# Patient Record
Sex: Male | Born: 1945 | Race: Black or African American | Hispanic: No | Marital: Single | Smoking: Former smoker
Health system: Southern US, Community
[De-identification: ages and names within clinical notes are randomized; demographics above are authoritative.]

## PROBLEM LIST (undated history)

## (undated) DIAGNOSIS — E119 Type 2 diabetes mellitus without complications: Secondary | ICD-10-CM

## (undated) DIAGNOSIS — Z86010 Personal history of colonic polyps: Secondary | ICD-10-CM

## (undated) HISTORY — PX: BACK SURGERY: SHX140

## (undated) HISTORY — PX: NECK SURGERY: SHX720

## (undated) HISTORY — DX: Personal history of colonic polyps: Z86.010

---

## 2000-07-17 ENCOUNTER — Encounter: Payer: Self-pay | Admitting: Family Medicine

## 2000-07-17 ENCOUNTER — Ambulatory Visit (HOSPITAL_COMMUNITY): Admission: RE | Admit: 2000-07-17 | Discharge: 2000-07-17 | Payer: Self-pay | Admitting: Family Medicine

## 2009-06-04 ENCOUNTER — Emergency Department (HOSPITAL_COMMUNITY): Admission: EM | Admit: 2009-06-04 | Discharge: 2009-06-04 | Payer: Self-pay | Admitting: Emergency Medicine

## 2009-06-11 ENCOUNTER — Ambulatory Visit: Payer: Self-pay | Admitting: Family Medicine

## 2009-07-11 ENCOUNTER — Ambulatory Visit: Payer: Self-pay | Admitting: Family Medicine

## 2009-07-25 ENCOUNTER — Encounter (INDEPENDENT_AMBULATORY_CARE_PROVIDER_SITE_OTHER): Payer: Self-pay | Admitting: *Deleted

## 2009-07-26 ENCOUNTER — Ambulatory Visit: Payer: Self-pay | Admitting: Internal Medicine

## 2009-08-09 ENCOUNTER — Ambulatory Visit: Payer: Self-pay | Admitting: Internal Medicine

## 2009-08-09 DIAGNOSIS — Z860100 Personal history of colon polyps, unspecified: Secondary | ICD-10-CM

## 2009-08-09 HISTORY — DX: Personal history of colonic polyps: Z86.010

## 2009-08-09 HISTORY — DX: Personal history of colon polyps, unspecified: Z86.0100

## 2009-08-13 ENCOUNTER — Encounter: Payer: Self-pay | Admitting: Internal Medicine

## 2009-08-27 ENCOUNTER — Encounter: Admission: RE | Admit: 2009-08-27 | Discharge: 2009-08-27 | Payer: Self-pay | Admitting: Orthopedic Surgery

## 2010-06-20 NOTE — Letter (Signed)
Summary: Patient Notice- Polyp Results  [facility]  [address] [place], [place] [address]   Phone: [phone]  Fax: [phone]        August 13, 2009 MRN: [id number]    George Collins [address] [place], [place]  [address]    Dear [name],  The polyp removed from your colon was adenomatous. This means that it was pre-cancerous or that  it had the potential to change into cancer over time.   I recommend that you have a repeat colonoscopy in 3 years to determine if you have developed any new polyps over time. If you develop any new rectal bleeding, abdominal pain or significant bowel habit changes, please contact us before then.  In addition to repeating colonoscopy, changing health habits may reduce your risk of having more colon polyps and possibly, colon cancer. You may lower your risk of future polyps and colon cancer by adopting healthy habits such as not smoking or using tobacco (if you do), being physically active, losing weight (if overweight), and eating a diet which includes fruits and vegetables and limits red meat.  Please call us if you are having persistent problems or have questions about your condition that have not been fully answered at this time.  Sincerely,  Iva Boop MD, [facility]  This letter has been electronically signed by your physician.  Appended Document: Patient Notice- Polyp Results letter mailed 3.28.11

## 2010-06-20 NOTE — Miscellaneous (Signed)
Summary: DIR COL SCR-AGE...EM  Clinical Lists Changes  Medications: Added new medication of MOVIPREP 100 GM  SOLR (PEG-KCL-NACL-NASULF-NA ASC-C) As directed - Signed Rx of MOVIPREP 100 GM  SOLR (PEG-KCL-NACL-NASULF-NA ASC-C) As directed;  #1 x 0;  Signed;  Entered by: Clide Cliff RN;  Authorized by: Iva Boop MD, FACG;  Method used: Electronically to [id number] [address] [place], [place]  [address], Ph: [phone] or [phone], Fax: [phone] Observations: Added new observation of NKA: T (07/26/2009 13:11)    Prescriptions: MOVIPREP 100 GM  SOLR (PEG-KCL-NACL-NASULF-NA ASC-C) As directed  #1 x 0   Entered by:   Clide Cliff RN   Authorized by:   Iva Boop MD, [facility]   Signed by:   Clide Cliff RN on 07/26/2009   Method used:   Electronically to        [id number] (retail)       [address]       [place], [place]  [address]       Ph: [phone] or [phone]       Fax: [phone]   RxID:   [id number]

## 2010-06-20 NOTE — Letter (Signed)
Summary: [facility] The Instructions  [facility]  [address] [place], [place] [address]   Phone: [phone]  Fax: [phone]       George Collins    [birth year]    MRN: [id number]        Procedure Day /Date:  Thursday 08/09/2009     Arrival Time: 10:00 am      Procedure Time: 11:00 am     Location of Procedure:                    _ x_  [facility] (4th Floor)   PREPARATION FOR COLONOSCOPY WITH MOVIPREP   Starting 5 days prior to your procedure Saturday 3/19 do not eat nuts, seeds, popcorn, corn, beans, peas,  salads, or any raw vegetables.  Do not take any fiber supplements (e.g. Metamucil, Citrucel, and Benefiber).  THE DAY BEFORE YOUR PROCEDURE         DATE: Wednesday 3/23  1.  Drink clear liquids the entire day-NO SOLID FOOD  2.  Do not drink anything colored red or purple.  Avoid juices with pulp.  No orange juice.  3.  Drink at least 64 oz. (8 glasses) of fluid/clear liquids during the day to prevent dehydration and help the prep work efficiently.  CLEAR LIQUIDS INCLUDE: Water Jello Ice Popsicles Tea (sugar ok, no milk/cream) Powdered fruit flavored drinks Coffee (sugar ok, no milk/cream) Gatorade Juice: apple, white grape, white cranberry  Lemonade Clear bullion, consomm, broth Carbonated beverages (any kind) Strained chicken noodle soup Hard Candy                             4.  In the morning, mix first dose of MoviPrep solution:    Empty 1 Pouch A and 1 Pouch B into the disposable container    Add lukewarm drinking water to the top line of the container. Mix to dissolve    Refrigerate (mixed solution should be used within 24 hrs)  5.  Begin drinking the prep at 5:00 p.m. The MoviPrep container is divided by 4 marks.   Every 15 minutes drink the solution down to the next mark (approximately 8 oz) until the full liter is complete.   6.  Follow completed prep with 16 oz of clear liquid of your choice (Nothing red or purple).   Continue to drink clear liquids until bedtime.  7.  Before going to bed, mix second dose of MoviPrep solution:    Empty 1 Pouch A and 1 Pouch B into the disposable container    Add lukewarm drinking water to the top line of the container. Mix to dissolve    Refrigerate  THE DAY OF YOUR PROCEDURE      DATE: Thursday 3/24  Beginning at 6:00 am (5 hours before procedure):         1. Every 15 minutes, drink the solution down to the next mark (approx 8 oz) until the full liter is complete.  2. Follow completed prep with 16 oz. of clear liquid of your choice.    3. You may drink clear liquids until 9:00 am (2 HOURS BEFORE PROCEDURE).   MEDICATION INSTRUCTIONS  Unless otherwise instructed, you should take regular prescription medications with a small sip of water   as early as possible the morning of your procedure.           OTHER INSTRUCTIONS  You will need a responsible adult  at least 63 years of age to accompany you and drive you home.   This person must remain in the waiting room during your procedure.  Wear loose fitting clothing that is easily removed.  Leave jewelry and other valuables at home.  However, you may wish to bring a book to read or  an iPod/MP3 player to listen to music as you wait for your procedure to start.  Remove all body piercing jewelry and leave at home.  Total time from sign-in until discharge is approximately 2-3 hours.  You should go home directly after your procedure and rest.  You can resume normal activities the  day after your procedure.  The day of your procedure you should not:   Drive   Make legal decisions   Operate machinery   Drink alcohol   Return to work  You will receive specific instructions about eating, activities and medications before you leave.    The above instructions have been reviewed and explained to me by   Clide Cliff, RN_______________________    I fully understand and can verbalize these  instructions _____________________________ Date _________

## 2010-06-20 NOTE — Procedures (Signed)
Summary: Colonoscopy  Patient: Ercell Razon Note: All result statuses are Final unless otherwise noted.  Tests: (1) Colonoscopy (COL)   COL Colonoscopy           DONE     [facility]     [address]     [place], [place]  [address]           COLONOSCOPY PROCEDURE REPORT           PATIENT:  Woodson, Macha  MR#:  [id number]     BIRTHDATE:  10-Jul-1945, 63 yrs. old  GENDER:  male     ENDOSCOPIST:  Iva Boop, MD, [facility]     REF. BY:  Sharlot Gowda, M.D.     PROCEDURE DATE:  08/09/2009     PROCEDURE:  Colonoscopy with snare polypectomy     ASA CLASS:  Class I     INDICATIONS:  Routine Risk Screening     MEDICATIONS:   Fentanyl 75 mcg IV, Versed 7 mg IV           DESCRIPTION OF PROCEDURE:   After the risks benefits and     alternatives of the procedure were thoroughly explained, informed     consent was obtained.  Digital rectal exam was performed and     revealed no abnormalities and normal prostate.   The LB CF-H180AL     [id number] endoscope was introduced through the anus and advanced to     the cecum, which was identified by both the appendix and ileocecal     valve, without limitations.  The quality of the prep was adequate,     using Nulytley.  The instrument was then slowly withdrawn as the     colon was fully examined.     Insertion: 3:59 minutes withdrawal: 14:15 minutes     <<PROCEDUREIMAGES>>           FINDINGS:  A pedunculated polyp was found in the rectum. It was 12     mm in size. Polyp was snared, then cauterized with monopolar     cautery. Retrieval was successful.  This was otherwise a normal     examination of the colon.   Retroflexed views in the rectum     revealed internal hemorrhoids.    The scope was then withdrawn     from the patient and the procedure completed.           COMPLICATIONS:  None     ENDOSCOPIC IMPRESSION:     1) 12 mm sessile polyp in the proximal rectum - removed           2) Internal hemorrhoids           3) Otherwise  normal examination, adequate prep           RECOMMENDATIONS:     1) No aspirin or NSAID's for 1 week.           REPEAT EXAM:  In for Colonoscopy, pending biopsy results.           Iva Boop, MD, [facility]           CC:  Sharlot Gowda, MD     The Patient           n.     eSIGNED:   Iva Boop at 08/09/2009 11:26 AM           Artis Delay, [id number]  Note: An exclamation mark (!) indicates a result that  was not dispersed into the flowsheet. Document Creation Date: 08/09/2009 11:27 AM _______________________________________________________________________  (1) Order result status: Final Collection or observation date-time: 08/09/2009 11:17 Requested date-time:  Receipt date-time:  Reported date-time:  Referring Physician:   Ordering Physician: Stan Head [id number]) Specimen Source:  Source: Launa Grill Order Number: [id number] Lab site:   Appended Document: Colonoscopy     Procedures Next Due Date:    Colonoscopy: 08/2012

## 2010-08-05 LAB — DIFFERENTIAL
Basophils Absolute: 0 10*3/uL (ref 0.0–0.1)
Basophils Relative: 0 % (ref 0–1)
Eosinophils Absolute: 0.2 10*3/uL (ref 0.0–0.7)
Eosinophils Relative: 1 % (ref 0–5)
Lymphocytes Relative: 14 % (ref 12–46)
Lymphs Abs: 1.8 10*3/uL (ref 0.7–4.0)
Monocytes Absolute: 0.9 10*3/uL (ref 0.1–1.0)
Monocytes Relative: 7 % (ref 3–12)
Neutro Abs: 9.9 10*3/uL — ABNORMAL HIGH (ref 1.7–7.7)
Neutrophils Relative %: 78 % — ABNORMAL HIGH (ref 43–77)

## 2010-08-05 LAB — CBC
HCT: 47.9 % (ref 39.0–52.0)
Hemoglobin: 16.2 g/dL (ref 13.0–17.0)
MCHC: 33.8 g/dL (ref 30.0–36.0)
MCV: 96.4 fL (ref 78.0–100.0)
Platelets: 251 10*3/uL (ref 150–400)
RBC: 4.97 MIL/uL (ref 4.22–5.81)
RDW: 13.2 % (ref 11.5–15.5)
WBC: 12.8 10*3/uL — ABNORMAL HIGH (ref 4.0–10.5)

## 2012-07-23 ENCOUNTER — Encounter: Payer: Self-pay | Admitting: Internal Medicine

## 2012-07-30 ENCOUNTER — Encounter: Payer: Self-pay | Admitting: Internal Medicine

## 2013-03-17 ENCOUNTER — Encounter: Payer: Self-pay | Admitting: Internal Medicine

## 2014-09-11 ENCOUNTER — Encounter: Payer: Self-pay | Admitting: Internal Medicine

## 2016-07-03 ENCOUNTER — Emergency Department (HOSPITAL_BASED_OUTPATIENT_CLINIC_OR_DEPARTMENT_OTHER)
Admission: EM | Admit: 2016-07-03 | Discharge: 2016-07-03 | Disposition: A | Discharge status: Home / Self Care | Payer: No Typology Code available for payment source | Attending: Emergency Medicine | Admitting: Emergency Medicine

## 2016-07-03 ENCOUNTER — Encounter (HOSPITAL_BASED_OUTPATIENT_CLINIC_OR_DEPARTMENT_OTHER): Payer: Self-pay

## 2016-07-03 ENCOUNTER — Emergency Department (HOSPITAL_BASED_OUTPATIENT_CLINIC_OR_DEPARTMENT_OTHER): Payer: No Typology Code available for payment source

## 2016-07-03 DIAGNOSIS — M545 Low back pain: Secondary | ICD-10-CM | POA: Insufficient documentation

## 2016-07-03 DIAGNOSIS — Z7984 Long term (current) use of oral hypoglycemic drugs: Secondary | ICD-10-CM | POA: Diagnosis not present

## 2016-07-03 DIAGNOSIS — Y9389 Activity, other specified: Secondary | ICD-10-CM | POA: Insufficient documentation

## 2016-07-03 DIAGNOSIS — E119 Type 2 diabetes mellitus without complications: Secondary | ICD-10-CM | POA: Insufficient documentation

## 2016-07-03 DIAGNOSIS — Y999 Unspecified external cause status: Secondary | ICD-10-CM | POA: Diagnosis not present

## 2016-07-03 DIAGNOSIS — Y92410 Unspecified street and highway as the place of occurrence of the external cause: Secondary | ICD-10-CM | POA: Diagnosis not present

## 2016-07-03 DIAGNOSIS — S199XXA Unspecified injury of neck, initial encounter: Secondary | ICD-10-CM | POA: Diagnosis present

## 2016-07-03 DIAGNOSIS — F17200 Nicotine dependence, unspecified, uncomplicated: Secondary | ICD-10-CM | POA: Insufficient documentation

## 2016-07-03 DIAGNOSIS — M542 Cervicalgia: Secondary | ICD-10-CM | POA: Diagnosis not present

## 2016-07-03 HISTORY — DX: Type 2 diabetes mellitus without complications: E11.9

## 2016-07-03 MED ORDER — ACETAMINOPHEN 325 MG PO TABS
650.0000 mg | ORAL_TABLET | Freq: Once | ORAL | Status: AC
  Administered 2016-07-03: 650 mg via ORAL
  Filled 2016-07-03: qty 2

## 2016-07-03 MED ORDER — CYCLOBENZAPRINE HCL 10 MG PO TABS: 10.0000 mg | ORAL_TABLET | Freq: Two times a day (BID) | ORAL | 0 refills | Status: DC | PRN

## 2016-07-03 MED FILL — CYCLOBENZAPRINE 10 MG TAB: 10 | 10 days supply | Qty: 20 | Fill #0
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
## 2016-07-03 NOTE — ED Notes (Signed)
ED Provider at bedside discussing test results and dispo plan of care. 

## 2016-07-03 NOTE — ED Notes (Signed)
ED Provider at bedside. 
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
## 2016-07-03 NOTE — Discharge Instructions (Signed)
Use Flexeril twice a day as needed for muscle pain and muscle spasm. Use Tylenol every 6 hours at home as needed for pain. Use warm compress to the area, massaging, and stretching. Please see her primary care provider if her symptoms are not improved in 1 week.  Get help right away if: You have: Numbness, tingling, or weakness in your arms or legs. Severe neck pain, especially tenderness in the middle of the back of your neck. Changes in bowel or bladder control. Increasing pain in any area of your body. Shortness of breath or light-headedness. Chest pain. Blood in your urine, stool, or vomit. Severe pain in your abdomen or your back. Severe or worsening headaches. Sudden vision loss or double vision. Your eye suddenly becomes red. Your pupil is an odd shape or size.

## 2019-07-02 ENCOUNTER — Ambulatory Visit: Payer: Medicare Other | Attending: Internal Medicine

## 2019-08-12 ENCOUNTER — Encounter: Payer: Self-pay | Admitting: General Practice

## 2020-05-07 ENCOUNTER — Ambulatory Visit: Payer: Medicare Other | Admitting: Cardiology

## 2020-05-07 ENCOUNTER — Other Ambulatory Visit: Payer: Self-pay

## 2020-05-07 ENCOUNTER — Encounter: Payer: Self-pay | Admitting: Cardiology

## 2020-05-07 VITALS — BP 130/80 | HR 76 | Ht 66.0 in | Wt 159.0 lb

## 2020-05-07 DIAGNOSIS — R0602 Shortness of breath: Secondary | ICD-10-CM | POA: Diagnosis not present

## 2020-05-07 DIAGNOSIS — R072 Precordial pain: Secondary | ICD-10-CM | POA: Diagnosis not present

## 2020-05-07 DIAGNOSIS — Z01812 Encounter for preprocedural laboratory examination: Secondary | ICD-10-CM

## 2020-05-07 MED ORDER — METOPROLOL TARTRATE 100 MG PO TABS: 100.0000 mg | ORAL_TABLET | Freq: Once | ORAL | 0 refills | Status: DC

## 2020-05-07 NOTE — Progress Notes (Signed)
Cardiology Office Note:    Date:  05/07/2020   ID:  George Collins, DOB 1945/12/02, MRN [id number]  PCP:  Glenis Smoker, MD  [facility] Cardiologist:  Candee Furbish, MD  [facility] Electrophysiologist:  None   Referring MD: Glenis Smoker, *     History of Present Illness:    George Collins is a 74 y.o. male here for the evaluation of shortness of breath at the request of Dr. Lindell Noe.  In review of in office note from 05/01/2020 he was feeling shortness of breath at baseline.  Stops if he gets tired.  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  He also gets care at the [facility] hospital working with a lift chair for his stairs.  Has diabetes, ongoing tobacco use half pack per day and is taking statin therapy.  There is been concerned about potential coronary artery disease given his multiple risk factors of age diabetes smoking.  COPD as well could be causing some of the symptoms.  Creatinine 0.85 glucose 142 hemoglobin A1c 6.1 potassium 4.1  Past Medical History:  Diagnosis Date  . Diabetes mellitus without complication ([place])   . 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 BACK SURGERY    . NECK SURGERY      Current Medications: Current Meds  Medication Sig  . atorvastatin (LIPITOR) 10 MG tablet Take 10 mg by mouth daily.  . cyclobenzaprine (FLEXERIL) 10 MG tablet Take 1 tablet (10 mg total) by mouth 2 (two) times daily as needed for muscle spasms.  . melatonin 3 MG TABS tablet Take 3 mg by mouth at bedtime.  Marland Kitchen METFORMIN HCL PO Take by mouth.     Allergies:   Patient has no known allergies.   Social History   Socioeconomic History  . Marital status: Single    Spouse name: Not on file  . Number of children: Not on file  . Years of education: Not on file  . Highest education level: Not on file  Occupational History  . Not on file  Tobacco Use  . Smoking  status: Current Every Day Smoker  . Smokeless tobacco: Never Used  Substance and Sexual Activity  . Alcohol use: Yes    Comment: occ  . Drug use: No  . Sexual activity: Not on file  Other Topics Concern  . Not on file  Social History Narrative  . Not on file   Social Determinants of Health   Financial Resource Strain: Not on file  Food Insecurity: Not on file  Transportation Needs: Not on file  Physical Activity: Not on file  Stress: Not on file  Social Connections: Not on file     Family History: The patient's mother lived into her 40s.  No early family history of CAD  ROS:   Please see the history of present illness.    Shortness of breath, tightness, no fevers chills nausea vomiting all other systems reviewed and are negative.  EKGs/Labs/Other Studies Reviewed:     EKG:  EKG is  ordered today.  The ekg ordered today demonstrates sinus rhythm 76 with no other abnormalities prior EKG from July 29, 2019 showed sinus rhythm 72 with no other significant abnormalities.  Recent Labs: No results found for requested labs within last 8760 hours.  Recent Lipid Panel No results found for: CHOL, TRIG, HDL, CHOLHDL, VLDL, LDLCALC, LDLDIRECT  Risk Assessment/Calculations:       Physical Exam:    VS:  BP 130/80 (BP Location: Left Arm, Patient Position: Sitting, Cuff Size: Normal)   Pulse 76   Ht $R'5\' 6"'Ga$  (1.676 m)   Wt 159 lb (72.1 kg)   SpO2 95%   BMI 25.66 kg/m     Wt Readings from Last 3 Encounters:  05/07/20 159 lb (72.1 kg)  07/03/16 178 lb (80.7 kg)     GEN:  Well nourished, well developed in no acute distress HEENT: Normal NECK: No JVD; No carotid bruits LYMPHATICS: No lymphadenopathy CARDIAC: RRR, no murmurs, rubs, gallops RESPIRATORY:  Clear to auscultation without rales, wheezing or rhonchi  ABDOMEN: Soft, non-tender, non-distended MUSCULOSKELETAL:  No edema; No deformity  SKIN: Warm and dry NEUROLOGIC:  Alert and oriented x 3 PSYCHIATRIC:  Normal  affect   ASSESSMENT:    1. Pre-procedure lab exam   2. Precordial pain   3. Shortness of breath    PLAN:    In order of problems listed above:  Shortness of breath/chest tightness -Multiple risk factors including smoking diabetes age.  -We will go ahead and check an echocardiogram to ensure proper structure and function of his heart -We will also check a coronary CT scan to evaluate for any signs of atherosclerosis that may be significant leading to his symptoms. -I agree with Dr. Lindell Noe that if coronary anatomy and pump function is reassuring, this certainly could be COPD related. PFTs would be warranted.  Tobacco use -Encourage cessation. He used to smoke 1 pack a day now down to half a pack.  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 He stated that every now and then the machines would break down and fumes would consume the area.  Will follow up with results of tests  Medication Adjustments/Labs and Tests Ordered: Current medicines are reviewed at length with the patient today.  Concerns regarding medicines are outlined above.  Orders Placed This Encounter  Procedures  . CT CORONARY MORPH W/CTA COR W/SCORE W/CA W/CM &/OR WO/CM  . CT CORONARY FRACTIONAL FLOW RESERVE DATA PREP  . CT CORONARY FRACTIONAL FLOW RESERVE FLUID ANALYSIS  . Basic metabolic panel  . EKG 12-Lead  . ECHOCARDIOGRAM COMPLETE   Meds ordered this encounter  Medications  . metoprolol tartrate (LOPRESSOR) 100 MG tablet    Sig: Take 1 tablet (100 mg total) by mouth once for 1 dose.    Dispense:  1 tablet    Refill:  0    Patient Instructions  Medication Instructions:  The current medical regimen is effective;  continue present plan and medications.  *If you need a refill on your cardiac medications before your next appointment, please call your pharmacy*  Lab Work: Will need to have blood work before your CT scan (BMP) If you have labs (blood work) drawn  today and your tests are completely normal, you will receive your results only by: Marland Kitchen MyChart Message (if you have MyChart) OR . A paper copy in the mail If you have any lab test that is abnormal or we need to change your treatment, we will call you to review the results.  Testing/Procedures: Your physician has requested that you have an echocardiogram. Echocardiography is a painless test that uses sound waves to create images of your heart. It provides your doctor with information about the size and shape of your heart and how well your heart's chambers and valves are working. This procedure takes  approximately one hour. There are no restrictions for this procedure.  Your cardiac CT will be scheduled at:   [facility] [address] [place], [place] [address] [phone]  Please arrive at the [facility] main entrance of [facility] 30 minutes prior to test start time. Proceed to the [facility] Radiology Department (first floor) to check-in and test prep.  Please follow these instructions carefully (unless otherwise directed):  On the Night Before the Test: . Be sure to Drink plenty of water. . Do not consume any caffeinated/decaffeinated beverages or chocolate 12 hours prior to your test. . Do not take any antihistamines 12 hours prior to your test.  On the Day of the Test: . Drink plenty of water. Do not drink any water within one hour of the test. . Do not eat any food 4 hours prior to the test. . You may take your regular medications prior to the test.  . Take metoprolol (Lopressor) two hours prior to test. . HOLD Furosemide/Hydrochlorothiazide morning of the test.  After the Test: . Drink plenty of water. . After receiving IV contrast, you may experience a mild flushed feeling. This is normal. . On occasion, you may experience a mild rash up to 24 hours after the test. This is not dangerous. If this occurs, you can take Benadryl 25 mg and increase your  fluid intake. . If you experience trouble breathing, this can be serious. If it is severe call 911 IMMEDIATELY. If it is mild, please call our office. . If you take any of these medications: Glipizide/Metformin, Avandament, Glucavance, please do not take 48 hours after completing test unless otherwise instructed.  Once we have confirmed authorization from your insurance company, we will call you to set up a date and time for your test. Based on how quickly your insurance processes prior authorizations requests, please allow up to 4 weeks to be contacted for scheduling your Cardiac CT appointment. Be advised that routine Cardiac CT appointments could be scheduled as many as 8 weeks after your provider has ordered it.  For non-scheduling related questions, please contact the cardiac imaging nurse navigator should you have any questions/concerns: Marchia Bond, Cardiac Imaging Nurse Navigator Burley Saver, Interim Cardiac Imaging Nurse [place] and Vascular Services Direct Office Dial: [phone]   For scheduling needs, including cancellations and rescheduling, please call [place], [phone].  Follow-Up: At [facility], you and your health needs are our priority.  As part of our continuing mission to provide you with exceptional heart care, we have created designated Provider Care Teams.  These Care Teams include your primary Cardiologist (physician) and Advanced Practice Providers (APPs -  Physician Assistants and Nurse Practitioners) who all work together to provide you with the care you need, when you need it.  We recommend signing up for the patient portal called "MyChart".  Sign up information is provided on this After Visit Summary.  MyChart is used to connect with patients for Virtual Visits (Telemedicine).  Patients are able to view lab/test results, encounter notes, upcoming appointments, etc.  Non-urgent messages can be sent to your provider as well.   To learn more  about what you can do with MyChart, go to [email or web address].    Your next appointment:   Follow up as determined after the above testing has been completed.  Thank you for choosing [facility]!!        Signed, Candee Furbish, MD  05/07/2020 5:04 PM  [facility]

## 2020-05-07 NOTE — Patient Instructions (Addendum)
Medication Instructions:  The current medical regimen is effective;  continue present plan and medications.  *If you need a refill on your cardiac medications before your next appointment, please call your pharmacy*  Lab Work: Will need to have blood work before your CT scan (BMP) If you have labs (blood work) drawn today and your tests are completely normal, you will receive your results only by: Marland Kitchen MyChart Message (if you have MyChart) OR . A paper copy in the mail If you have any lab test that is abnormal or we need to change your treatment, we will call you to review the results.  Testing/Procedures: Your physician has requested that you have an echocardiogram. Echocardiography is a painless test that uses sound waves to create images of your heart. It provides your doctor with information about the size and shape of your heart and how well your heart's chambers and valves are working. This procedure takes approximately one hour. There are no restrictions for this procedure.  Your cardiac CT will be scheduled at:   [facility] [address] [place], [place] [address] [phone]  Please arrive at the [facility] main entrance of [facility] 30 minutes prior to test start time. Proceed to the [facility] Radiology Department (first floor) to check-in and test prep.  Please follow these instructions carefully (unless otherwise directed):  On the Night Before the Test: . Be sure to Drink plenty of water. . Do not consume any caffeinated/decaffeinated beverages or chocolate 12 hours prior to your test. . Do not take any antihistamines 12 hours prior to your test.  On the Day of the Test: . Drink plenty of water. Do not drink any water within one hour of the test. . Do not eat any food 4 hours prior to the test. . You may take your regular medications prior to the test.  . Take metoprolol (Lopressor) two hours prior to test. . HOLD  Furosemide/Hydrochlorothiazide morning of the test.  After the Test: . Drink plenty of water. . After receiving IV contrast, you may experience a mild flushed feeling. This is normal. . On occasion, you may experience a mild rash up to 24 hours after the test. This is not dangerous. If this occurs, you can take Benadryl 25 mg and increase your fluid intake. . If you experience trouble breathing, this can be serious. If it is severe call 911 IMMEDIATELY. If it is mild, please call our office. . If you take any of these medications: Glipizide/Metformin, Avandament, Glucavance, please do not take 48 hours after completing test unless otherwise instructed.  Once we have confirmed authorization from your insurance company, we will call you to set up a date and time for your test. Based on how quickly your insurance processes prior authorizations requests, please allow up to 4 weeks to be contacted for scheduling your Cardiac CT appointment. Be advised that routine Cardiac CT appointments could be scheduled as many as 8 weeks after your provider has ordered it.  For non-scheduling related questions, please contact the cardiac imaging nurse navigator should you have any questions/concerns: Marchia Bond, Cardiac Imaging Nurse Navigator Burley Saver, Interim Cardiac Imaging Nurse [place] and Vascular Services Direct Office Dial: [phone]   For scheduling needs, including cancellations and rescheduling, please call [place], [phone].  Follow-Up: At [facility], you and your health needs are our priority.  As part of our continuing mission to provide you with exceptional heart care, we have created  designated Provider Care Teams.  These Care Teams include your primary Cardiologist (physician) and Advanced Practice Providers (APPs -  Physician Assistants and Nurse Practitioners) who all work together to provide you with the care you need, when you need it.  We recommend signing  up for the patient portal called "MyChart".  Sign up information is provided on this After Visit Summary.  MyChart is used to connect with patients for Virtual Visits (Telemedicine).  Patients are able to view lab/test results, encounter notes, upcoming appointments, etc.  Non-urgent messages can be sent to your provider as well.   To learn more about what you can do with MyChart, go to [email or web address].    Your next appointment:   Follow up as determined after the above testing has been completed.  Thank you for choosing [facility]!!

## 2020-05-25 ENCOUNTER — Telehealth: Payer: Self-pay | Admitting: *Deleted

## 2020-05-25 MED ORDER — METOPROLOL TARTRATE 100 MG PO TABS: 100.0000 mg | ORAL_TABLET | Freq: Once | ORAL | 0 refills | Status: DC

## 2020-05-25 NOTE — Telephone Encounter (Signed)
Pt aware to come in for BMP on Thursday 1/13 before his CT scheduled on 1/17.  Also aware to pick up Metoprolol tartrate tablet sent into [facility] [address] to take before CT.

## 2020-05-31 ENCOUNTER — Other Ambulatory Visit: Payer: Medicare Other | Admitting: *Deleted

## 2020-05-31 DIAGNOSIS — R0602 Shortness of breath: Secondary | ICD-10-CM | POA: Diagnosis not present

## 2020-05-31 DIAGNOSIS — Z01812 Encounter for preprocedural laboratory examination: Secondary | ICD-10-CM | POA: Diagnosis not present

## 2020-05-31 DIAGNOSIS — R072 Precordial pain: Secondary | ICD-10-CM

## 2020-06-01 ENCOUNTER — Telehealth (HOSPITAL_COMMUNITY): Payer: Self-pay | Admitting: Emergency Medicine

## 2020-06-01 LAB — BASIC METABOLIC PANEL
BUN/Creatinine Ratio: 15 (ref 10–24)
BUN: 13 mg/dL (ref 8–27)
CO2: 26 mmol/L (ref 20–29)
Calcium: 9.3 mg/dL (ref 8.6–10.2)
Chloride: 98 mmol/L (ref 96–106)
Creatinine, Ser: 0.86 mg/dL (ref 0.76–1.27)
GFR calc Af Amer: 100 mL/min/{1.73_m2} (ref >59)
GFR calc non Af Amer: 87 mL/min/{1.73_m2} (ref >59)
Glucose: 159 mg/dL — ABNORMAL HIGH (ref 65–99)
Potassium: 3.9 mmol/L (ref 3.5–5.2)
Sodium: 138 mmol/L (ref 134–144)

## 2020-06-01 NOTE — Telephone Encounter (Signed)
Calling to review CCTA instructions however patient wishing to cancel appt due to the impending weather.  R/s for 06/11/20 at 12:15p  Will call closer to next appt to review instructions  Rockwell Alexandria RN Navigator Cardiac Imaging [facility] Vascular Services [phone] Office  [phone] Cell

## 2020-06-04 ENCOUNTER — Other Ambulatory Visit (HOSPITAL_COMMUNITY): Payer: Medicare Other

## 2020-06-04 ENCOUNTER — Ambulatory Visit (HOSPITAL_COMMUNITY): Payer: Medicare Other

## 2020-06-04 NOTE — Telephone Encounter (Signed)
Thanks for update °Mark Skains, MD ° °

## 2020-06-06 ENCOUNTER — Ambulatory Visit (HOSPITAL_COMMUNITY): Payer: Medicare Other

## 2020-06-08 ENCOUNTER — Telehealth (HOSPITAL_COMMUNITY): Payer: Self-pay | Admitting: *Deleted

## 2020-06-08 NOTE — Telephone Encounter (Signed)
Reaching out to patient to offer assistance regarding upcoming cardiac imaging study; pt verbalizes understanding of appt date/time, parking situation and where to check in, pre-test NPO status and medications ordered, and verified current allergies; name and call back number provided for further questions should they arise  Mert Dietrick RN Navigator Cardiac Imaging [place] [facility] [phone] office [phone] cell  

## 2020-06-11 ENCOUNTER — Other Ambulatory Visit: Payer: Self-pay

## 2020-06-11 ENCOUNTER — Ambulatory Visit (HOSPITAL_COMMUNITY)
Admission: RE | Admit: 2020-06-11 | Discharge: 2020-06-11 | Disposition: A | Discharge status: Home / Self Care | Payer: Medicare Other | Source: Ambulatory Visit | Attending: Cardiology | Admitting: Cardiology

## 2020-06-11 ENCOUNTER — Encounter: Payer: Self-pay | Admitting: *Deleted

## 2020-06-11 DIAGNOSIS — Z006 Encounter for examination for normal comparison and control in clinical research program: Secondary | ICD-10-CM

## 2020-06-11 DIAGNOSIS — R072 Precordial pain: Secondary | ICD-10-CM | POA: Insufficient documentation

## 2020-06-11 MED ORDER — IOHEXOL 350 MG/ML SOLN
80.0000 mL | Freq: Once | INTRAVENOUS | Status: AC | PRN
  Administered 2020-06-11: 80 mL via INTRAVENOUS

## 2020-06-11 MED ORDER — NITROGLYCERIN 0.4 MG SL SUBL
SUBLINGUAL_TABLET | SUBLINGUAL | Status: AC
  Filled 2020-06-11: qty 2

## 2020-06-11 MED ORDER — NITROGLYCERIN 0.4 MG SL SUBL
0.8000 mg | SUBLINGUAL_TABLET | Freq: Once | SUBLINGUAL | Status: AC
  Administered 2020-06-11: 0.8 mg via SUBLINGUAL

## 2020-06-11 NOTE — Research (Signed)
IDENTIFY Informed Consent                  Subject Name:   George Collins   Subject met inclusion and exclusion criteria.  The informed consent form, study requirements and expectations were reviewed with the subject and questions and concerns were addressed prior to the signing of the consent form.  The subject verbalized understanding of the trial requirements.  The subject agreed to participate in the IDENTIFY trial and signed the informed consent.  The informed consent was obtained prior to performance of any protocol-specific procedures for the subject.  A copy of the signed informed consent was given to the subject and a copy was placed in the subject's medical record.   [place] Jaxxen Voong, Research Assistant 01/24/20222 10:52 a.m

## 2020-06-13 ENCOUNTER — Other Ambulatory Visit: Payer: Self-pay

## 2020-06-13 ENCOUNTER — Telehealth: Payer: Self-pay | Admitting: *Deleted

## 2020-06-13 ENCOUNTER — Encounter: Payer: Self-pay | Admitting: Cardiology

## 2020-06-13 ENCOUNTER — Telehealth: Payer: Self-pay | Admitting: Cardiology

## 2020-06-13 ENCOUNTER — Telehealth (INDEPENDENT_AMBULATORY_CARE_PROVIDER_SITE_OTHER): Payer: Medicare Other | Admitting: Cardiology

## 2020-06-13 VITALS — Ht 65.0 in | Wt 160.0 lb

## 2020-06-13 DIAGNOSIS — I2510 Atherosclerotic heart disease of native coronary artery without angina pectoris: Secondary | ICD-10-CM

## 2020-06-13 DIAGNOSIS — R072 Precordial pain: Secondary | ICD-10-CM | POA: Diagnosis not present

## 2020-06-13 DIAGNOSIS — R0602 Shortness of breath: Secondary | ICD-10-CM | POA: Diagnosis not present

## 2020-06-13 DIAGNOSIS — E1169 Type 2 diabetes mellitus with other specified complication: Secondary | ICD-10-CM | POA: Diagnosis not present

## 2020-06-13 DIAGNOSIS — E782 Mixed hyperlipidemia: Secondary | ICD-10-CM

## 2020-06-13 DIAGNOSIS — Z01812 Encounter for preprocedural laboratory examination: Secondary | ICD-10-CM | POA: Diagnosis not present

## 2020-06-13 MED ORDER — ATORVASTATIN CALCIUM 40 MG PO TABS: 40.0000 mg | ORAL_TABLET | Freq: Every day | ORAL | 3 refills | Status: DC

## 2020-06-13 MED ORDER — SODIUM CHLORIDE 0.9% FLUSH: 3.0000 mL | Freq: Two times a day (BID) | INTRAVENOUS | Status: AC

## 2020-06-13 NOTE — Progress Notes (Signed)
Virtual Visit via Telephone Note   This visit type was conducted due to national recommendations for restrictions regarding the COVID-19 Pandemic (e.g. social distancing) in an effort to limit this patient's exposure and mitigate transmission in our community.  Due to his co-morbid illnesses, this patient is at least at moderate risk for complications without adequate follow up.  This format is felt to be most appropriate for this patient at this time.  The patient did not have access to video technology/had technical difficulties with video requiring transitioning to audio format only (telephone).  All issues noted in this document were discussed and addressed.  No physical exam could be performed with this format.  Please refer to the patient's chart for his  consent to telehealth for [facility].    Date:  06/13/2020   ID:  George Collins, DOB [birth year], MRN [id number] The patient was identified using 2 identifiers.  Patient Location: Home Provider Location: Home Office  PCP:  Shon Hale, MD  Cardiologist:  Donato Schultz, MD  Electrophysiologist:  None   Evaluation Performed:  Follow-Up Visit  Chief Complaint: Abnormal CT scan  History of Present Illness:    George Collins is a 75 y.o. male here to discuss abnormal coronary CT scan.  CT scan 06/11/2020:  RCA is a large dominant artery that gives rise to PDA and PLA. There is no proximal calcified plaque, 0-24%. There is stair step artifact in the mid RCA as well as proximal PL branch that does not allow for interpretation of those specific segments.  Left main is a large artery that gives rise to LAD and LCX arteries.  LAD is a large vessel that has two calcified plaques in the proximal segment, 0-24%. There is a large stair step artifact that does not allow for interpretation of a portion of the mid branch as well as distal branch.  LCX is a non-dominant artery that gives rise to one medium OM1 branch.  There is calcified plaque in the proximal AV groove circumflex, 24-49% stenosis. There is a portion of the mid branch after the first OM that does not allow for interpretation due to stair step artifact.  There is a moderate sized ramus branch with a mid portion that does not allow for interpretation due to stair step artifact.  Other findings:  Normal pulmonary vein drainage into the left atrium.  Normal left atrial appendage without a thrombus.  Normal size of the pulmonary artery.  Please see radiology report for non cardiac findings.  IMPRESSION: 1. Coronary calcium score of 182. This was 69 percentile for age and sex matched control.  2. Normal coronary origin with right dominance.  3. There is non obstructive CAD (calcified plaque) in the proximal LAD, Circumflex and RCA. There are significant portions of these mid and distal vessels that are not interpretable due to artifact. Consider further evaluation with cardiac catheterization if clinically warranted.  4.  COPD noted per radiology review.  Donato Schultz, MD [facility]   Electronically Signed   By: Donato Schultz MD  He was seen previously on 05/07/2020 for the evaluation ofshortness of breath at the request of Dr. Chanetta Marshall.  In review of in office note from 05/01/2020 he was feeling shortness of breath at baseline.  Stops if he gets tired.  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  He also gets care at the [facility] hospital working with a lift chair for his  stairs.  Has diabetes, ongoing tobacco use half pack per day and is taking statin therapy.  There is been concerned about potential coronary artery disease given his multiple risk factors of age diabetes smoking.  COPD as well could be causing some of the symptoms.  Creatinine 0.85 glucose 142 hemoglobin A1c 6.1 potassium 4.1  The patient does not have symptoms concerning for COVID-19 infection (fever, chills, cough, or new  shortness of breath).    Past Medical History:  Diagnosis Date  . Diabetes mellitus without complication (HCC)   . 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 BACK SURGERY    . NECK SURGERY       Current Meds  Medication Sig  . albuterol (VENTOLIN HFA) 108 (90 Base) MCG/ACT inhaler Inhale 2 puffs into the lungs every 6 (six) hours as needed for wheezing or shortness of breath.  Marland Kitchen atorvastatin (LIPITOR) 40 MG tablet Take 1 tablet (40 mg total) by mouth daily.  . budesonide-formoterol (SYMBICORT) 160-4.5 MCG/ACT inhaler Inhale 2 puffs into the lungs 2 (two) times daily.  . melatonin 3 MG TABS tablet Take 3 mg by mouth at bedtime.  Marland Kitchen METFORMIN HCL PO Take 1,000 mg by mouth in the morning and at bedtime.  . [DISCONTINUED] atorvastatin (LIPITOR) 10 MG tablet Take 10 mg by mouth daily.     Allergies:   Patient has no known allergies.   Social History   Tobacco Use  . Smoking status: Current Every Day Smoker  . Smokeless tobacco: Never Used  Substance Use Topics  . Alcohol use: Yes    Comment: occ  . Drug use: No     Family Hx: The patient's family history is not on file.  ROS:   Please see the history of present illness.    Denies any bleeding fevers chills nausea vomiting syncope All other systems reviewed and are negative.   Prior CV studies:   The following studies were reviewed today:  As above  Labs/Other Tests and Data Reviewed:    EKG: 05/07/2020-sinus rhythm 76 no other abnormalities  Recent Labs: 05/31/2020: BUN 13; Creatinine, Ser 0.86; Potassium 3.9; Sodium 138   Recent Lipid Panel No results found for: CHOL, TRIG, HDL, CHOLHDL, LDLCALC, LDLDIRECT  Wt Readings from Last 3 Encounters:  06/13/20 160 lb (72.6 kg)  05/07/20 159 lb (72.1 kg)  07/03/16 178 lb (80.7 kg)     Risk Assessment/Calculations:     Objective:    Vital Signs:  Ht 5\' 5"  (1.651 m)   Wt 160 lb (72.6 kg)   BMI 26.63  kg/m    VITAL SIGNS:  reviewed  Alert and oriented x3 in no acute distress, pleasant, able to complete full sentences without difficulty.  ASSESSMENT & PLAN:    Coronary artery disease/shortness of breath/possible anginal equivalent -Coronary CT scan did show areas of coronary calcification as noted above.  These areas did appear to be nonobstructive.  There are however mid and distal regions of the LAD RCA and circumflex that were unable to be visualized secondary to stairstep artifact likely due to breath motion artifact.  Because of this, we will proceed with cardiac catheterization to ensure that there is no evidence of underlying coronary disease in those regions.  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 -I will refer him to pulmonary clinic for further evaluation as well. -This may be the underlying reason for his shortness of breath with activity if coronaries do not show any  flow limitation.  Hyperlipidemia with diabetes -I will increase his atorvastatin from 10 mg up to 40 mg, high intensity dose.  Repeat lipid panel and ALT in 3 months.   Tobacco use -Encourage cessation. He used to smoke 1 pack a day now down to half a pack.  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  Shared Decision Making/Informed Consent The risks [stroke (1 in 1000), death (1 in 1000), kidney failure [usually temporary] (1 in 500), bleeding (1 in 200), allergic reaction [possibly serious] (1 in 200)], benefits (diagnostic support and management of coronary artery disease) and alternatives of a cardiac catheterization were discussed in detail with George Collins and he is willing to proceed. His wife was present for discussion.       COVID-19 Education: The signs and symptoms of COVID-19 were discussed with the patient and how to seek care for testing (follow up with  PCP or arrange E-visit).  The importance of social distancing was discussed today.  Time:   Today, I have spent 21 minutes with the patient with telehealth technology discussing the above problems.     Medication Adjustments/Labs and Tests Ordered: Current medicines are reviewed at length with the patient today.  Concerns regarding medicines are outlined above.   Tests Ordered: Orders Placed This Encounter  Procedures  . CBC  . ALT  . Lipid panel  . Ambulatory referral to Pulmonology    Medication Changes: Meds ordered this encounter  Medications  . atorvastatin (LIPITOR) 40 MG tablet    Sig: Take 1 tablet (40 mg total) by mouth daily.    Dispense:  90 tablet    Refill:  3    Follow Up:  In Person in 3 month(s)  Signed, Donato Schultz, MD  06/13/2020 5:25 PM     [facility]

## 2020-06-13 NOTE — Telephone Encounter (Signed)
George Collins is calling stating he received a call from our office after his virtual appt today, but I was unable to find what it was in regards to. Please advise.

## 2020-06-13 NOTE — Addendum Note (Signed)
Addended by: Jake Bathe on: 06/13/2020 05:28 PM   Modules accepted: Orders, SmartSet

## 2020-06-13 NOTE — H&P (View-Only) (Signed)
Virtual Visit via Telephone Note   This visit type was conducted due to national recommendations for restrictions regarding the COVID-19 Pandemic (e.g. social distancing) in an effort to limit this patient's exposure and mitigate transmission in our community.  Due to his co-morbid illnesses, this patient is at least at moderate risk for complications without adequate follow up.  This format is felt to be most appropriate for this patient at this time.  The patient did not have access to video technology/had technical difficulties with video requiring transitioning to audio format only (telephone).  All issues noted in this document were discussed and addressed.  No physical exam could be performed with this format.  Please refer to the patient's chart for his  consent to telehealth for [facility].    Date:  06/13/2020   ID:  George Collins, DOB [birth year], MRN [id number] The patient was identified using 2 identifiers.  Patient Location: Home Provider Location: Home Office  PCP:  Shon Hale, MD  Cardiologist:  Donato Schultz, MD  Electrophysiologist:  None   Evaluation Performed:  Follow-Up Visit  Chief Complaint: Abnormal CT scan  History of Present Illness:    George Collins is a 75 y.o. male here to discuss abnormal coronary CT scan.  CT scan 06/11/2020:  RCA is a large dominant artery that gives rise to PDA and PLA. There is no proximal calcified plaque, 0-24%. There is stair step artifact in the mid RCA as well as proximal PL branch that does not allow for interpretation of those specific segments.  Left main is a large artery that gives rise to LAD and LCX arteries.  LAD is a large vessel that has two calcified plaques in the proximal segment, 0-24%. There is a large stair step artifact that does not allow for interpretation of a portion of the mid branch as well as distal branch.  LCX is a non-dominant artery that gives rise to one medium OM1 branch.  There is calcified plaque in the proximal AV groove circumflex, 24-49% stenosis. There is a portion of the mid branch after the first OM that does not allow for interpretation due to stair step artifact.  There is a moderate sized ramus branch with a mid portion that does not allow for interpretation due to stair step artifact.  Other findings:  Normal pulmonary vein drainage into the left atrium.  Normal left atrial appendage without a thrombus.  Normal size of the pulmonary artery.  Please see radiology report for non cardiac findings.  IMPRESSION: 1. Coronary calcium score of 182. This was 69 percentile for age and sex matched control.  2. Normal coronary origin with right dominance.  3. There is non obstructive CAD (calcified plaque) in the proximal LAD, Circumflex and RCA. There are significant portions of these mid and distal vessels that are not interpretable due to artifact. Consider further evaluation with cardiac catheterization if clinically warranted.  4.  COPD noted per radiology review.  Donato Schultz, MD [facility]   Electronically Signed   By: Donato Schultz MD  He was seen previously on 05/07/2020 for the evaluation ofshortness of breath at the request of Dr. Chanetta Marshall.  In review of in office note from 05/01/2020 he was feeling shortness of breath at baseline.  Stops if he gets tired.  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  He also gets care at the [facility] hospital working with a lift chair for his  stairs.  Has diabetes, ongoing tobacco use half pack per day and is taking statin therapy.  There is been concerned about potential coronary artery disease given his multiple risk factors of age diabetes smoking.  COPD as well could be causing some of the symptoms.  Creatinine 0.85 glucose 142 hemoglobin A1c 6.1 potassium 4.1  The patient does not have symptoms concerning for COVID-19 infection (fever, chills, cough, or new  shortness of breath).    Past Medical History:  Diagnosis Date  . Diabetes mellitus without complication (HCC)   . 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 BACK SURGERY    . NECK SURGERY       Current Meds  Medication Sig  . albuterol (VENTOLIN HFA) 108 (90 Base) MCG/ACT inhaler Inhale 2 puffs into the lungs every 6 (six) hours as needed for wheezing or shortness of breath.  . atorvastatin (LIPITOR) 40 MG tablet Take 1 tablet (40 mg total) by mouth daily.  . budesonide-formoterol (SYMBICORT) 160-4.5 MCG/ACT inhaler Inhale 2 puffs into the lungs 2 (two) times daily.  . melatonin 3 MG TABS tablet Take 3 mg by mouth at bedtime.  . METFORMIN HCL PO Take 1,000 mg by mouth in the morning and at bedtime.  . [DISCONTINUED] atorvastatin (LIPITOR) 10 MG tablet Take 10 mg by mouth daily.     Allergies:   Patient has no known allergies.   Social History   Tobacco Use  . Smoking status: Current Every Day Smoker  . Smokeless tobacco: Never Used  Substance Use Topics  . Alcohol use: Yes    Comment: occ  . Drug use: No     Family Hx: The patient's family history is not on file.  ROS:   Please see the history of present illness.    Denies any bleeding fevers chills nausea vomiting syncope All other systems reviewed and are negative.   Prior CV studies:   The following studies were reviewed today:  As above  Labs/Other Tests and Data Reviewed:    EKG: 05/07/2020-sinus rhythm 76 no other abnormalities  Recent Labs: 05/31/2020: BUN 13; Creatinine, Ser 0.86; Potassium 3.9; Sodium 138   Recent Lipid Panel No results found for: CHOL, TRIG, HDL, CHOLHDL, LDLCALC, LDLDIRECT  Wt Readings from Last 3 Encounters:  06/13/20 160 lb (72.6 kg)  05/07/20 159 lb (72.1 kg)  07/03/16 178 lb (80.7 kg)     Risk Assessment/Calculations:     Objective:    Vital Signs:  Ht 5' 5" (1.651 m)   Wt 160 lb (72.6 kg)   BMI 26.63  kg/m    VITAL SIGNS:  reviewed  Alert and oriented x3 in no acute distress, pleasant, able to complete full sentences without difficulty.  ASSESSMENT & PLAN:    Coronary artery disease/shortness of breath/possible anginal equivalent -Coronary CT scan did show areas of coronary calcification as noted above.  These areas did appear to be nonobstructive.  There are however mid and distal regions of the LAD RCA and circumflex that were unable to be visualized secondary to stairstep artifact likely due to breath motion artifact.  Because of this, we will proceed with cardiac catheterization to ensure that there is no evidence of underlying coronary disease in those regions.  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 -I will refer him to pulmonary clinic for further evaluation as well. -This may be the underlying reason for his shortness of breath with activity if coronaries do not show any   flow limitation.  Hyperlipidemia with diabetes -I will increase his atorvastatin from 10 mg up to 40 mg, high intensity dose.  Repeat lipid panel and ALT in 3 months.   Tobacco use -Encourage cessation. He used to smoke 1 pack a day now down to half a pack.  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  Shared Decision Making/Informed Consent The risks [stroke (1 in 1000), death (1 in 1000), kidney failure [usually temporary] (1 in 500), bleeding (1 in 200), allergic reaction [possibly serious] (1 in 200)], benefits (diagnostic support and management of coronary artery disease) and alternatives of a cardiac catheterization were discussed in detail with George Collins and he is willing to proceed. His wife was present for discussion.       COVID-19 Education: The signs and symptoms of COVID-19 were discussed with the patient and how to seek care for testing (follow up with  PCP or arrange E-visit).  The importance of social distancing was discussed today.  Time:   Today, I have spent 21 minutes with the patient with telehealth technology discussing the above problems.     Medication Adjustments/Labs and Tests Ordered: Current medicines are reviewed at length with the patient today.  Concerns regarding medicines are outlined above.   Tests Ordered: Orders Placed This Encounter  Procedures  . CBC  . ALT  . Lipid panel  . Ambulatory referral to Pulmonology    Medication Changes: Meds ordered this encounter  Medications  . atorvastatin (LIPITOR) 40 MG tablet    Sig: Take 1 tablet (40 mg total) by mouth daily.    Dispense:  90 tablet    Refill:  3    Follow Up:  In Person in 3 month(s)  Signed, Donato Schultz, MD  06/13/2020 5:25 PM     [facility]

## 2020-06-13 NOTE — Patient Instructions (Signed)
Medication Instructions:  Your physician has recommended you make the following change in your medication:  INCREASE atorvastatin (Lipitor) to 40mg  Daily  *If you need a refill on your cardiac medications before your next appointment, please call your pharmacy*   Lab Work: CBC Fri. Jun 15, 2020 IN 3 MONTHS: lipid panel and ALT If you have labs (blood work) drawn today and your tests are completely normal, you will receive your results only by: Jun 17, 2020 MyChart Message (if you have MyChart) OR . A paper copy in the mail If you have any lab test that is abnormal or we need to change your treatment, we will call you to review the results.   Testing/Procedures: EKG prior to heart catheterization  Your physician has requested that you have a cardiac catheterization. Cardiac catheterization is used to diagnose and/or treat various heart conditions. Doctors may recommend this procedure for a number of different reasons. The most common reason is to evaluate chest pain. Chest pain can be a symptom of coronary artery disease (CAD), and cardiac catheterization can show whether plaque is narrowing or blocking your heart's arteries. This procedure is also used to evaluate the valves, as well as measure the blood flow and oxygen levels in different parts of your heart.   Due to recent COVID-19 restrictions implemented by our local and state authorities and in an effort to keep both patients and staff as safe as possible, our hospital system requires COVID-19 testing prior to certain scheduled hospital procedures.  Please go to [address] [facility]. [place], [place] on Sat. Jun 16, 2020 at noon  .  This is a drive up testing site.  You will not need to exit your vehicle.  You will not be billed at the time of testing but may receive a bill later depending on your insurance. You must agree to self-quarantine from the time of your testing until the procedure date on Tuesday Feb. 1, 2022.  This should included  staying home with ONLY the people you live with.  Avoid take-out, grocery store shopping or leaving the house for any non-emergent reason.  Failure to have your COVID-19 test done on the date and time you have been scheduled will result in cancellation of your procedure.  Please call our office at [phone] if you have any questions.      Follow-Up: At [facility], you and your health needs are our priority.  As part of our continuing mission to provide you with exceptional heart care, we have created designated Provider Care Teams.  These Care Teams include your primary Cardiologist (physician) and Advanced Practice Providers (APPs -  Physician Assistants and Nurse Practitioners) who all work together to provide you with the care you need, when you need it.  We recommend signing up for the patient portal called "MyChart".  Sign up information is provided on this After Visit Summary.  MyChart is used to connect with patients for Virtual Visits (Telemedicine).  Patients are able to view lab/test results, encounter notes, upcoming appointments, etc.  Non-urgent messages can be sent to your provider as well.   To learn more about what you can do with MyChart, go to [facility].    Your next appointment:   3 month(s)  The format for your next appointment:   In Person  Provider:   You may see [email or web address], MD or one of the following Advanced Practice Providers on your designated Care Team:    Donato Schultz, NP    Other  Instructions    [place] [facility] CARDIOVASCULAR DIVISION [facility] ST OFFICE [address] [place] [address] Dept: [phone] Loc: [phone]  George Collins  06/13/2020  You are scheduled for a Cardiac Catheterization on Tuesday, February 1 with Dr. Lance Muss.  1. Please arrive at the [facility] (Main Entrance A) at [facility]: [address] [place], [place] [address] at  10:00 AM (This time is two hours before your procedure to ensure your preparation). Free valet parking service is available.   Special note: Every effort is made to have your procedure done on time. Please understand that emergencies sometimes delay scheduled procedures.  2. Diet: Do not eat solid foods after midnight.  The patient may have clear liquids until 5am upon the day of the procedure.  3. Labs: You will need to have blood drawn on Friday, January 28 at [facility]. [address], [place]  Open: 7:30am - 5pm    Phone: [phone]. You do not need to be fasting.  4. Medication instructions in preparation for your procedure:   Contrast Allergy: No    Do not take Diabetes Med Glucophage (Metformin) on the day of the procedure and HOLD 48 HOURS AFTER THE PROCEDURE.  On the morning of your procedure, take your Aspirin 81mg  and any morning medicines NOT listed above.  You may use sips of water.  5. Plan for one night stay--bring personal belongings. 6. Bring a current list of your medications and current insurance cards. 7. You MUST have a responsible person to drive you home. 8. Someone MUST be with you the first 24 hours after you arrive home or your discharge will be delayed. 9. Please wear clothes that are easy to get on and off and wear slip-on shoes.  Thank you for allowing to care for you!   -- [facility]

## 2020-06-13 NOTE — Telephone Encounter (Signed)
Spoke with the patient in regards to visit with Dr. Anne Fu today.

## 2020-06-13 NOTE — Telephone Encounter (Signed)
  Patient Consent for Virtual Visit         George Collins has provided verbal consent on 06/13/2020 for a virtual visit (video or telephone).   CONSENT FOR VIRTUAL VISIT FOR:  George Collins  By participating in this virtual visit I agree to the following:  I hereby voluntarily request, consent and authorize [facility] and its employed or contracted physicians, physician assistants, nurse practitioners or other licensed health care professionals (the Practitioner), to provide me with telemedicine health care services (the "Services") as deemed necessary by the treating Practitioner. I acknowledge and consent to receive the Services by the Practitioner via telemedicine. I understand that the telemedicine visit will involve communicating with the Practitioner through live audiovisual communication technology and the disclosure of certain medical information by electronic transmission. I acknowledge that I have been given the opportunity to request an in-person assessment or other available alternative prior to the telemedicine visit and am voluntarily participating in the telemedicine visit.  I understand that I have the right to withhold or withdraw my consent to the use of telemedicine in the course of my care at any time, without affecting my right to future care or treatment, and that the Practitioner or I may terminate the telemedicine visit at any time. I understand that I have the right to inspect all information obtained and/or recorded in the course of the telemedicine visit and may receive copies of available information for a reasonable fee.  I understand that some of the potential risks of receiving the Services via telemedicine include:  Marland Kitchen Delay or interruption in medical evaluation due to technological equipment failure or disruption; . Information transmitted may not be sufficient (e.g. poor resolution of images) to allow for appropriate medical decision making by the  Practitioner; and/or  . In rare instances, security protocols could fail, causing a breach of personal health information.  Furthermore, I acknowledge that it is my responsibility to provide information about my medical history, conditions and care that is complete and accurate to the best of my ability. I acknowledge that Practitioner's advice, recommendations, and/or decision may be based on factors not within their control, such as incomplete or inaccurate data provided by me or distortions of diagnostic images or specimens that may result from electronic transmissions. I understand that the practice of medicine is not an exact science and that Practitioner makes no warranties or guarantees regarding treatment outcomes. I acknowledge that a copy of this consent can be made available to me via my patient portal [facility] MyChart), or I can request a printed copy by calling the office of [facility].    I understand that my insurance will be billed for this visit.   I have read or had this consent read to me. . I understand the contents of this consent, which adequately explains the benefits and risks of the Services being provided via telemedicine.  . I have been provided ample opportunity to ask questions regarding this consent and the Services and have had my questions answered to my satisfaction. . I give my informed consent for the services to be provided through the use of telemedicine in my medical care

## 2020-06-14 NOTE — Addendum Note (Signed)
Addended by: Sharin Grave on: 06/14/2020 02:23 PM   Modules accepted: Orders

## 2020-06-15 ENCOUNTER — Other Ambulatory Visit (HOSPITAL_COMMUNITY)
Admission: RE | Admit: 2020-06-15 | Discharge: 2020-06-15 | Disposition: A | Discharge status: Home / Self Care | Payer: Medicare Other | Source: Ambulatory Visit | Attending: Interventional Cardiology | Admitting: Interventional Cardiology

## 2020-06-15 ENCOUNTER — Encounter: Payer: Self-pay | Admitting: *Deleted

## 2020-06-15 ENCOUNTER — Other Ambulatory Visit (HOSPITAL_COMMUNITY): Payer: Medicare Other

## 2020-06-15 ENCOUNTER — Ambulatory Visit (INDEPENDENT_AMBULATORY_CARE_PROVIDER_SITE_OTHER): Payer: Medicare Other | Admitting: *Deleted

## 2020-06-15 ENCOUNTER — Other Ambulatory Visit: Payer: Self-pay

## 2020-06-15 ENCOUNTER — Other Ambulatory Visit: Payer: Medicare Other | Admitting: *Deleted

## 2020-06-15 VITALS — HR 68 | Ht 65.0 in | Wt 160.0 lb

## 2020-06-15 DIAGNOSIS — R0602 Shortness of breath: Secondary | ICD-10-CM

## 2020-06-15 DIAGNOSIS — E1169 Type 2 diabetes mellitus with other specified complication: Secondary | ICD-10-CM | POA: Diagnosis not present

## 2020-06-15 DIAGNOSIS — E782 Mixed hyperlipidemia: Secondary | ICD-10-CM | POA: Diagnosis not present

## 2020-06-15 DIAGNOSIS — R072 Precordial pain: Secondary | ICD-10-CM

## 2020-06-15 DIAGNOSIS — Z01812 Encounter for preprocedural laboratory examination: Secondary | ICD-10-CM | POA: Insufficient documentation

## 2020-06-15 DIAGNOSIS — Z20822 Contact with and (suspected) exposure to covid-19: Secondary | ICD-10-CM | POA: Insufficient documentation

## 2020-06-15 DIAGNOSIS — I2510 Atherosclerotic heart disease of native coronary artery without angina pectoris: Secondary | ICD-10-CM

## 2020-06-15 LAB — BASIC METABOLIC PANEL
BUN/Creatinine Ratio: 16 (ref 10–24)
BUN: 13 mg/dL (ref 8–27)
CO2: 25 mmol/L (ref 20–29)
Calcium: 8.8 mg/dL (ref 8.6–10.2)
Chloride: 104 mmol/L (ref 96–106)
Creatinine, Ser: 0.83 mg/dL (ref 0.76–1.27)
GFR calc Af Amer: 102 mL/min/{1.73_m2} (ref >59)
GFR calc non Af Amer: 88 mL/min/{1.73_m2} (ref >59)
Glucose: 166 mg/dL — ABNORMAL HIGH (ref 65–99)
Potassium: 4.2 mmol/L (ref 3.5–5.2)
Sodium: 140 mmol/L (ref 134–144)

## 2020-06-15 LAB — SARS CORONAVIRUS 2 (TAT 6-24 HRS): SARS Coronavirus 2: NEGATIVE

## 2020-06-15 LAB — CBC
Hematocrit: 50.1 % (ref 37.5–51.0)
Hemoglobin: 16.8 g/dL (ref 13.0–17.7)
MCH: 32.5 pg (ref 26.6–33.0)
MCHC: 33.5 g/dL (ref 31.5–35.7)
MCV: 97 fL (ref 79–97)
Platelets: 192 10*3/uL (ref 150–450)
RBC: 5.17 x10E6/uL (ref 4.14–5.80)
RDW: 11 % — ABNORMAL LOW (ref 11.6–15.4)
WBC: 9.3 10*3/uL (ref 3.4–10.8)

## 2020-06-15 LAB — LIPID PANEL
Chol/HDL Ratio: 3.2 ratio (ref 0.0–5.0)
Cholesterol, Total: 143 mg/dL (ref 100–199)
HDL: 45 mg/dL (ref >39)
LDL Chol Calc (NIH): 76 mg/dL (ref 0–99)
Triglycerides: 121 mg/dL (ref 0–149)
VLDL Cholesterol Cal: 22 mg/dL (ref 5–40)

## 2020-06-15 LAB — ALT: ALT: 18 IU/L (ref 0–44)

## 2020-06-15 NOTE — Patient Instructions (Signed)
Medication Instructions:  The current medical regimen is effective;  continue present plan and medications.  *If you need a refill on your cardiac medications before your next appointment, please call your pharmacy*   Lab Work: You have blood work today as instructed.  If you have labs (blood work) drawn today and your tests are completely normal, you will receive your results only by: Marland Kitchen MyChart Message (if you have MyChart) OR . A paper copy in the mail If you have any lab test that is abnormal or we need to change your treatment, we will call you to review the results.   Testing/Procedures: Follow instructions for Covid screening today. And cardiac cath as scheduled.   Follow-Up: At [facility], you and your health needs are our priority.  As part of our continuing mission to provide you with exceptional heart care, we have created designated Provider Care Teams.  These Care Teams include your primary Cardiologist (physician) and Advanced Practice Providers (APPs -  Physician Assistants and Nurse Practitioners) who all work together to provide you with the care you need, when you need it.  We recommend signing up for the patient portal called "MyChart".  Sign up information is provided on this After Visit Summary.  MyChart is used to connect with patients for Virtual Visits (Telemedicine).  Patients are able to view lab/test results, encounter notes, upcoming appointments, etc.  Non-urgent messages can be sent to your provider as well.   To learn more about what you can do with MyChart, go to [email or web address].    Your next appointment:   Follow up as instructed.  Thank you for choosing [facility]!!

## 2020-06-15 NOTE — Progress Notes (Signed)
Pt here today for pre-op EKG as ordered by Dr Anne Fu.  NSR HR 68.

## 2020-06-16 ENCOUNTER — Other Ambulatory Visit (HOSPITAL_COMMUNITY): Payer: Medicare Other

## 2020-06-18 ENCOUNTER — Telehealth: Payer: Self-pay | Admitting: *Deleted

## 2020-06-18 NOTE — Telephone Encounter (Signed)
Pt contacted pre-catheterization scheduled at [facility] for: Tuesday June 19, 2020 12 Noon Verified arrival time and place: [facility] Main Entrance [facility]) at: 10 AM  No solid food after midnight prior to cath, clear liquids until 5 AM day of procedure.  Hold: Metformin-day of procedure and 48 hours post procedure  Except hold medications AM meds can be  taken pre-cath with sips of water including: ASA 81 mg   Confirmed patient has responsible adult to drive home post procedure and be with patient first 24 hours after arriving home: yes  You are allowed ONE visitor in the waiting room during the time you are at the hospital for your procedure. Both you and your visitor must wear a mask once you enter the hospital.    Reviewed procedure/mask/visitor instructions with patient.

## 2020-06-19 ENCOUNTER — Ambulatory Visit (HOSPITAL_COMMUNITY)
Admission: RE | Admit: 2020-06-19 | Discharge: 2020-06-19 | Disposition: A | Discharge status: Home / Self Care | Payer: Medicare Other | Attending: Interventional Cardiology | Admitting: Interventional Cardiology

## 2020-06-19 ENCOUNTER — Encounter (HOSPITAL_COMMUNITY)
Admission: RE | Disposition: A | Discharge status: Home / Self Care | Payer: Self-pay | Source: Home / Self Care | Attending: Interventional Cardiology

## 2020-06-19 DIAGNOSIS — E785 Hyperlipidemia, unspecified: Secondary | ICD-10-CM | POA: Diagnosis not present

## 2020-06-19 DIAGNOSIS — Z79899 Other long term (current) drug therapy: Secondary | ICD-10-CM | POA: Insufficient documentation

## 2020-06-19 DIAGNOSIS — E782 Mixed hyperlipidemia: Secondary | ICD-10-CM

## 2020-06-19 DIAGNOSIS — R072 Precordial pain: Secondary | ICD-10-CM

## 2020-06-19 DIAGNOSIS — R0602 Shortness of breath: Secondary | ICD-10-CM | POA: Diagnosis not present

## 2020-06-19 DIAGNOSIS — R931 Abnormal findings on diagnostic imaging of heart and coronary circulation: Secondary | ICD-10-CM

## 2020-06-19 DIAGNOSIS — I2510 Atherosclerotic heart disease of native coronary artery without angina pectoris: Secondary | ICD-10-CM | POA: Insufficient documentation

## 2020-06-19 DIAGNOSIS — Z7951 Long term (current) use of inhaled steroids: Secondary | ICD-10-CM | POA: Diagnosis not present

## 2020-06-19 DIAGNOSIS — J439 Emphysema, unspecified: Secondary | ICD-10-CM | POA: Diagnosis not present

## 2020-06-19 DIAGNOSIS — Z7984 Long term (current) use of oral hypoglycemic drugs: Secondary | ICD-10-CM | POA: Insufficient documentation

## 2020-06-19 DIAGNOSIS — E119 Type 2 diabetes mellitus without complications: Secondary | ICD-10-CM | POA: Diagnosis not present

## 2020-06-19 DIAGNOSIS — F17210 Nicotine dependence, cigarettes, uncomplicated: Secondary | ICD-10-CM | POA: Diagnosis not present

## 2020-06-19 DIAGNOSIS — Z77098 Contact with and (suspected) exposure to other hazardous, chiefly nonmedicinal, chemicals: Secondary | ICD-10-CM | POA: Insufficient documentation

## 2020-06-19 DIAGNOSIS — E1169 Type 2 diabetes mellitus with other specified complication: Secondary | ICD-10-CM

## 2020-06-19 HISTORY — PX: RIGHT/LEFT HEART CATH AND CORONARY ANGIOGRAPHY: CATH118266

## 2020-06-19 LAB — POCT I-STAT EG7
Acid-Base Excess: 2 mmol/L (ref 0.0–2.0)
Bicarbonate: 29.4 mmol/L — ABNORMAL HIGH (ref 20.0–28.0)
Calcium, Ion: 1.25 mmol/L (ref 1.15–1.40)
HCT: 51 % (ref 39.0–52.0)
Hemoglobin: 17.3 g/dL — ABNORMAL HIGH (ref 13.0–17.0)
O2 Saturation: 74 %
Potassium: 4.3 mmol/L (ref 3.5–5.1)
Sodium: 141 mmol/L (ref 135–145)
TCO2: 31 mmol/L (ref 22–32)
pCO2, Ven: 52.8 mmHg (ref 44.0–60.0)
pH, Ven: 7.353 (ref 7.250–7.430)
pO2, Ven: 42 mmHg (ref 32.0–45.0)

## 2020-06-19 LAB — GLUCOSE, CAPILLARY
Glucose-Capillary: 180 mg/dL — ABNORMAL HIGH (ref 70–99)
Glucose-Capillary: 113 mg/dL — ABNORMAL HIGH (ref 70–99)

## 2020-06-19 LAB — POCT I-STAT 7, (LYTES, BLD GAS, ICA,H+H)
Acid-Base Excess: 2 mmol/L (ref 0.0–2.0)
Bicarbonate: 28.2 mmol/L — ABNORMAL HIGH (ref 20.0–28.0)
Calcium, Ion: 1.17 mmol/L (ref 1.15–1.40)
HCT: 50 % (ref 39.0–52.0)
Hemoglobin: 17 g/dL (ref 13.0–17.0)
O2 Saturation: 95 %
Potassium: 4.2 mmol/L (ref 3.5–5.1)
Sodium: 141 mmol/L (ref 135–145)
TCO2: 30 mmol/L (ref 22–32)
pCO2 arterial: 47.9 mmHg (ref 32.0–48.0)
pH, Arterial: 7.378 (ref 7.350–7.450)
pO2, Arterial: 76 mmHg — ABNORMAL LOW (ref 83.0–108.0)

## 2020-06-19 SURGERY — RIGHT/LEFT HEART CATH AND CORONARY ANGIOGRAPHY
Anesthesia: LOCAL

## 2020-06-19 MED ORDER — SODIUM CHLORIDE 0.9 % WEIGHT BASED INFUSION
3.0000 mL/kg/h | INTRAVENOUS | Status: AC
  Administered 2020-06-19: 3 mL/kg/h via INTRAVENOUS

## 2020-06-19 MED ORDER — METFORMIN HCL 1000 MG PO TABS: 500.0000 mg | ORAL_TABLET | Freq: Two times a day (BID) | ORAL | Status: AC

## 2020-06-19 MED ORDER — HEPARIN (PORCINE) IN NACL 1000-0.9 UT/500ML-% IV SOLN
INTRAVENOUS | Status: DC | PRN
  Administered 2020-06-19 (×2): 500 mL

## 2020-06-19 MED ORDER — FENTANYL CITRATE (PF) 100 MCG/2ML IJ SOLN
INTRAMUSCULAR | Status: AC
  Filled 2020-06-19: qty 2

## 2020-06-19 MED ORDER — ONDANSETRON HCL 4 MG/2ML IJ SOLN: 4.0000 mg | Freq: Four times a day (QID) | INTRAMUSCULAR | Status: DC | PRN

## 2020-06-19 MED ORDER — MIDAZOLAM HCL 2 MG/2ML IJ SOLN
INTRAMUSCULAR | Status: DC | PRN
  Administered 2020-06-19: 2 mg via INTRAVENOUS

## 2020-06-19 MED ORDER — SODIUM CHLORIDE 0.9 % IV SOLN: 250.0000 mL | INTRAVENOUS | Status: DC | PRN

## 2020-06-19 MED ORDER — SODIUM CHLORIDE 0.9% FLUSH
3.0000 mL | INTRAVENOUS | Status: DC | PRN
Start: 2020-06-19 — End: 2020-06-19

## 2020-06-19 MED ORDER — IOHEXOL 350 MG/ML SOLN
INTRAVENOUS | Status: DC | PRN
  Administered 2020-06-19: 60 mL via INTRA_ARTERIAL

## 2020-06-19 MED ORDER — SODIUM CHLORIDE 0.9% FLUSH: 3.0000 mL | Freq: Two times a day (BID) | INTRAVENOUS | Status: DC

## 2020-06-19 MED ORDER — VERAPAMIL HCL 2.5 MG/ML IV SOLN
INTRAVENOUS | Status: AC
  Filled 2020-06-19: qty 2

## 2020-06-19 MED ORDER — SODIUM CHLORIDE 0.9 % WEIGHT BASED INFUSION: 1.0000 mL/kg/h | INTRAVENOUS | Status: DC

## 2020-06-19 MED ORDER — MIDAZOLAM HCL 2 MG/2ML IJ SOLN
INTRAMUSCULAR | Status: AC
  Filled 2020-06-19: qty 2

## 2020-06-19 MED ORDER — LIDOCAINE HCL (PF) 1 % IJ SOLN
INTRAMUSCULAR | Status: DC | PRN
  Administered 2020-06-19: 5 mL

## 2020-06-19 MED ORDER — ASPIRIN 81 MG PO CHEW: 81.0000 mg | CHEWABLE_TABLET | ORAL | Status: DC

## 2020-06-19 MED ORDER — SODIUM CHLORIDE 0.9 % IV SOLN: INTRAVENOUS | Status: AC

## 2020-06-19 MED ORDER — LIDOCAINE HCL (PF) 1 % IJ SOLN
INTRAMUSCULAR | Status: AC
  Filled 2020-06-19: qty 30

## 2020-06-19 MED ORDER — SODIUM CHLORIDE 0.9% FLUSH: 3.0000 mL | INTRAVENOUS | Status: DC | PRN

## 2020-06-19 MED ORDER — HEPARIN SODIUM (PORCINE) 1000 UNIT/ML IJ SOLN
INTRAMUSCULAR | Status: DC | PRN
  Administered 2020-06-19: 3500 [IU] via INTRAVENOUS

## 2020-06-19 MED ORDER — ACETAMINOPHEN 325 MG PO TABS: 650.0000 mg | ORAL_TABLET | ORAL | Status: DC | PRN

## 2020-06-19 MED ORDER — HYDRALAZINE HCL 20 MG/ML IJ SOLN: 10.0000 mg | INTRAMUSCULAR | Status: DC | PRN

## 2020-06-19 MED ORDER — VERAPAMIL HCL 2.5 MG/ML IV SOLN
INTRAVENOUS | Status: DC | PRN
  Administered 2020-06-19: 10 mL via INTRA_ARTERIAL

## 2020-06-19 MED ORDER — HEPARIN (PORCINE) IN NACL 1000-0.9 UT/500ML-% IV SOLN
INTRAVENOUS | Status: AC
  Filled 2020-06-19: qty 1000

## 2020-06-19 MED ORDER — LABETALOL HCL 5 MG/ML IV SOLN: 10.0000 mg | INTRAVENOUS | Status: DC | PRN

## 2020-06-19 MED ORDER — FENTANYL CITRATE (PF) 100 MCG/2ML IJ SOLN
INTRAMUSCULAR | Status: DC | PRN
  Administered 2020-06-19: 25 ug via INTRAVENOUS

## 2020-06-19 SURGICAL SUPPLY — 13 items
BAG SNAP BAND KOVER 36X36 (MISCELLANEOUS) ×2 IMPLANT
CATH 5FR JL3.5 JR4 ANG PIG MP (CATHETERS) ×2 IMPLANT
CATH BALLN WEDGE 5F 110CM (CATHETERS) ×2 IMPLANT
COVER DOME SNAP 22 D (MISCELLANEOUS) ×3 IMPLANT
DEVICE RAD COMP TR BAND LRG (VASCULAR PRODUCTS) ×2 IMPLANT
GLIDESHEATH SLEND SS 6F .021 (SHEATH) ×2 IMPLANT
GUIDEWIRE INQWIRE 1.5J.035X260 (WIRE) ×1 IMPLANT
INQWIRE 1.5J .035X260CM (WIRE) ×2
KIT HEART LEFT (KITS) ×2 IMPLANT
PACK CARDIAC CATHETERIZATION (CUSTOM PROCEDURE TRAY) ×2 IMPLANT
SHEATH GLIDE SLENDER 4/5FR (SHEATH) ×2 IMPLANT
TRANSDUCER W/STOPCOCK (MISCELLANEOUS) ×2 IMPLANT
TUBING CIL FLEX 10 FLL-RA (TUBING) ×2 IMPLANT

## 2020-06-19 NOTE — Discharge Instructions (Signed)
Radial Site Care ELEVATE RIGHT WRIST 24 HOURS   This sheet gives you information about how to care for yourself after your procedure. Your health care provider may also give you more specific instructions. If you have problems or questions, contact your health care provider. What can I expect after the procedure? After the procedure, it is common to have:  Bruising and tenderness at the catheter insertion area. Follow these instructions at home: Medicines  Take over-the-counter and prescription medicines only as told by your health care provider. Insertion site care 1. Follow instructions from your health care provider about how to take care of your insertion site. Make sure you: ? Wash your hands with soap and water before you change your bandage (dressing). If soap and water are not available, use hand sanitizer. ? Change your dressing as told by your health care provider. 2. Check your insertion site every day for signs of infection. Check for: ? Redness, swelling, or pain. ? Fluid or blood. ? Pus or a bad smell. ? Warmth. 3. Do not take baths, swim, or use a hot tub for 5 days. 4. You may shower 24-48 hours after the procedure. ? Remove the dressing and gently wash the site with plain soap and water. ? Pat the area dry with a clean towel. ? Do not rub the site. That could cause bleeding. 5. Do not apply powder or lotion to the site. Activity  1. For 24 hours after the procedure, or as directed by your health care provider: ? Do not flex or bend the affected arm. ? Do not push or pull heavy objects with the affected arm. ? Do not drive yourself home from the hospital or clinic. You may drive 24 hours after the procedure. ? Do not operate machinery or power tools. ? KEEP ARM ELEVATED THE REMAINDER OF THE DAY. 2. Do not push, pull or lift anything that is heavier than 10 lb for 5 days. 3. Ask your health care provider when it is okay to: ? Return to work or school. ? Resume  usual physical activities or sports. ? Resume sexual activity. General instructions  If the catheter site starts to bleed, raise your arm and put firm pressure on the site. If the bleeding does not stop, get help right away. This is a medical emergency.  DRINK PLENTY OF FLUIDS FOR THE NEXT 2-3 DAYS.  If you went home on the same day as your procedure, a responsible adult should be with you for the first 24 hours after you arrive home.  Keep all follow-up visits as told by your health care provider. This is important. Contact a health care provider if:  You have a fever.  You have redness, swelling, or yellow drainage around your insertion site. Get help right away if:  You have unusual pain at the radial site.  The catheter insertion area swells very fast.  The insertion area is bleeding, and the bleeding does not stop when you hold steady pressure on the area.  Your arm or hand becomes pale, cool, tingly, or numb. These symptoms may represent a serious problem that is an emergency. Do not wait to see if the symptoms will go away. Get medical help right away. Call your local emergency services (911 in the [place] Do not drive yourself to the hospital. Summary  After the procedure, it is common to have bruising and tenderness at the site.  Follow instructions from your health care provider about how to take care  of your radial site wound. Check the wound every day for signs of infection.  Do not push, pull or lift anything that is heavier than 10 lb for 5 days.  This information is not intended to replace advice given to you by your health care provider. Make sure you discuss any questions you have with your health care provider. Document Revised: 06/10/2017 Document Reviewed: 06/10/2017 Elsevier Patient Education  2020 [employer].

## 2020-06-19 NOTE — Interval H&P Note (Signed)
Cath Lab Visit (complete for each Cath Lab visit)  Clinical Evaluation Leading to the Procedure:   ACS: No.  Non-ACS:    Anginal Classification: CCS III  Anti-ischemic medical therapy: Minimal Therapy (1 class of medications)  Non-Invasive Test Results: Intermediate-risk stress test findings: cardiac mortality 1-3%/year  Prior CABG: No previous CABG   Chronic DOE for 3 years.  COPD noted in history.  Adding on right heart cath.  Patient is agreeable.  All questions answered.    History and Physical Interval Note:  06/19/2020 11:54 AM  George Collins  has presented today for surgery, with the diagnosis of CAD, shortness of breath.  The various methods of treatment have been discussed with the patient and family. After consideration of risks, benefits and other options for treatment, the patient has consented to  Procedure(s): LEFT HEART CATH AND CORONARY ANGIOGRAPHY (N/A) as a surgical intervention.  The patient's history has been reviewed, patient examined, no change in status, stable for surgery.  I have reviewed the patient's chart and labs.  Questions were answered to the patient's satisfaction.     Lance Muss

## 2020-06-20 ENCOUNTER — Encounter (HOSPITAL_COMMUNITY): Payer: Self-pay | Admitting: Interventional Cardiology

## 2020-06-22 ENCOUNTER — Ambulatory Visit (HOSPITAL_COMMUNITY): Payer: Medicare Other | Attending: Internal Medicine

## 2020-06-22 ENCOUNTER — Other Ambulatory Visit: Payer: Self-pay

## 2020-06-22 DIAGNOSIS — R0602 Shortness of breath: Secondary | ICD-10-CM | POA: Diagnosis not present

## 2020-06-22 DIAGNOSIS — R072 Precordial pain: Secondary | ICD-10-CM

## 2020-06-22 LAB — ECHOCARDIOGRAM COMPLETE
Area-P 1/2: 1.91 cm2
S' Lateral: 2.9 cm

## 2020-08-13 DIAGNOSIS — M25512 Pain in left shoulder: Secondary | ICD-10-CM | POA: Diagnosis not present

## 2020-08-13 DIAGNOSIS — E119 Type 2 diabetes mellitus without complications: Secondary | ICD-10-CM | POA: Diagnosis not present

## 2020-08-13 DIAGNOSIS — Z0000 Encounter for general adult medical examination without abnormal findings: Secondary | ICD-10-CM | POA: Diagnosis not present

## 2020-08-13 DIAGNOSIS — E7800 Pure hypercholesterolemia, unspecified: Secondary | ICD-10-CM | POA: Diagnosis not present

## 2020-08-13 DIAGNOSIS — E113399 Type 2 diabetes mellitus with moderate nonproliferative diabetic retinopathy without macular edema, unspecified eye: Secondary | ICD-10-CM | POA: Diagnosis not present

## 2020-08-13 DIAGNOSIS — Z720 Tobacco use: Secondary | ICD-10-CM | POA: Diagnosis not present

## 2020-08-13 DIAGNOSIS — J449 Chronic obstructive pulmonary disease, unspecified: Secondary | ICD-10-CM | POA: Diagnosis not present

## 2020-08-13 DIAGNOSIS — I10 Essential (primary) hypertension: Secondary | ICD-10-CM | POA: Diagnosis not present

## 2020-08-15 DIAGNOSIS — M25512 Pain in left shoulder: Secondary | ICD-10-CM | POA: Diagnosis not present

## 2020-09-19 ENCOUNTER — Ambulatory Visit: Payer: Medicare Other | Admitting: Cardiology

## 2020-09-19 ENCOUNTER — Other Ambulatory Visit: Payer: Medicare Other

## 2020-09-25 ENCOUNTER — Institutional Professional Consult (permissible substitution): Payer: Medicare Other | Admitting: Pulmonary Disease

## 2020-09-26 ENCOUNTER — Telehealth: Payer: Self-pay

## 2020-09-26 DIAGNOSIS — Z006 Encounter for examination for normal comparison and control in clinical research program: Secondary | ICD-10-CM

## 2020-09-26 NOTE — Telephone Encounter (Signed)
Called patient for 90 day Identify phone call pt stated he is still having the occasional shortness of breath and that has continues to follow up with [facility] hospital. I reminded the patient that we would be calling him back around January for a year follow up phone call.

## 2020-10-07 DIAGNOSIS — Z03818 Encounter for observation for suspected exposure to other biological agents ruled out: Secondary | ICD-10-CM | POA: Diagnosis not present

## 2020-10-07 DIAGNOSIS — J00 Acute nasopharyngitis [common cold]: Secondary | ICD-10-CM | POA: Diagnosis not present

## 2020-10-07 DIAGNOSIS — Z20822 Contact with and (suspected) exposure to covid-19: Secondary | ICD-10-CM | POA: Diagnosis not present

## 2020-11-08 DIAGNOSIS — J449 Chronic obstructive pulmonary disease, unspecified: Secondary | ICD-10-CM | POA: Diagnosis not present

## 2020-11-08 DIAGNOSIS — E113399 Type 2 diabetes mellitus with moderate nonproliferative diabetic retinopathy without macular edema, unspecified eye: Secondary | ICD-10-CM | POA: Diagnosis not present

## 2020-11-08 DIAGNOSIS — E7800 Pure hypercholesterolemia, unspecified: Secondary | ICD-10-CM | POA: Diagnosis not present

## 2021-01-09 DIAGNOSIS — Z720 Tobacco use: Secondary | ICD-10-CM | POA: Diagnosis not present

## 2021-01-09 DIAGNOSIS — E113399 Type 2 diabetes mellitus with moderate nonproliferative diabetic retinopathy without macular edema, unspecified eye: Secondary | ICD-10-CM | POA: Diagnosis not present

## 2021-01-09 DIAGNOSIS — Z1211 Encounter for screening for malignant neoplasm of colon: Secondary | ICD-10-CM | POA: Diagnosis not present

## 2021-01-09 DIAGNOSIS — J449 Chronic obstructive pulmonary disease, unspecified: Secondary | ICD-10-CM | POA: Diagnosis not present

## 2021-01-09 DIAGNOSIS — R634 Abnormal weight loss: Secondary | ICD-10-CM | POA: Diagnosis not present

## 2021-01-09 DIAGNOSIS — E11319 Type 2 diabetes mellitus with unspecified diabetic retinopathy without macular edema: Secondary | ICD-10-CM | POA: Diagnosis not present

## 2021-01-09 DIAGNOSIS — E7800 Pure hypercholesterolemia, unspecified: Secondary | ICD-10-CM | POA: Diagnosis not present

## 2021-01-25 ENCOUNTER — Inpatient Hospital Stay (HOSPITAL_BASED_OUTPATIENT_CLINIC_OR_DEPARTMENT_OTHER)
Admission: EM | Admit: 2021-01-25 | Discharge: 2021-01-28 | DRG: 190 | Disposition: A | Discharge status: Home / Self Care | Payer: Medicare Other | Attending: Internal Medicine | Admitting: Internal Medicine

## 2021-01-25 ENCOUNTER — Emergency Department (HOSPITAL_BASED_OUTPATIENT_CLINIC_OR_DEPARTMENT_OTHER): Payer: Medicare Other | Admitting: Radiology

## 2021-01-25 ENCOUNTER — Other Ambulatory Visit: Payer: Self-pay

## 2021-01-25 ENCOUNTER — Encounter (HOSPITAL_BASED_OUTPATIENT_CLINIC_OR_DEPARTMENT_OTHER): Payer: Self-pay | Admitting: Obstetrics and Gynecology

## 2021-01-25 DIAGNOSIS — J441 Chronic obstructive pulmonary disease with (acute) exacerbation: Principal | ICD-10-CM | POA: Diagnosis present

## 2021-01-25 DIAGNOSIS — Z79899 Other long term (current) drug therapy: Secondary | ICD-10-CM | POA: Diagnosis not present

## 2021-01-25 DIAGNOSIS — J9601 Acute respiratory failure with hypoxia: Secondary | ICD-10-CM | POA: Diagnosis present

## 2021-01-25 DIAGNOSIS — R0602 Shortness of breath: Secondary | ICD-10-CM | POA: Diagnosis not present

## 2021-01-25 DIAGNOSIS — Z23 Encounter for immunization: Secondary | ICD-10-CM | POA: Diagnosis not present

## 2021-01-25 DIAGNOSIS — R079 Chest pain, unspecified: Secondary | ICD-10-CM | POA: Diagnosis not present

## 2021-01-25 DIAGNOSIS — J439 Emphysema, unspecified: Secondary | ICD-10-CM | POA: Diagnosis not present

## 2021-01-25 DIAGNOSIS — E119 Type 2 diabetes mellitus without complications: Secondary | ICD-10-CM | POA: Diagnosis present

## 2021-01-25 DIAGNOSIS — D72829 Elevated white blood cell count, unspecified: Secondary | ICD-10-CM | POA: Diagnosis not present

## 2021-01-25 DIAGNOSIS — Z7984 Long term (current) use of oral hypoglycemic drugs: Secondary | ICD-10-CM | POA: Diagnosis not present

## 2021-01-25 DIAGNOSIS — J9600 Acute respiratory failure, unspecified whether with hypoxia or hypercapnia: Secondary | ICD-10-CM | POA: Diagnosis not present

## 2021-01-25 DIAGNOSIS — Z7951 Long term (current) use of inhaled steroids: Secondary | ICD-10-CM | POA: Diagnosis not present

## 2021-01-25 DIAGNOSIS — F17210 Nicotine dependence, cigarettes, uncomplicated: Secondary | ICD-10-CM | POA: Diagnosis present

## 2021-01-25 DIAGNOSIS — Z720 Tobacco use: Secondary | ICD-10-CM

## 2021-01-25 DIAGNOSIS — E785 Hyperlipidemia, unspecified: Secondary | ICD-10-CM | POA: Diagnosis not present

## 2021-01-25 DIAGNOSIS — Z20822 Contact with and (suspected) exposure to covid-19: Secondary | ICD-10-CM | POA: Diagnosis not present

## 2021-01-25 DIAGNOSIS — Z85048 Personal history of other malignant neoplasm of rectum, rectosigmoid junction, and anus: Secondary | ICD-10-CM | POA: Diagnosis not present

## 2021-01-25 DIAGNOSIS — J449 Chronic obstructive pulmonary disease, unspecified: Secondary | ICD-10-CM | POA: Diagnosis not present

## 2021-01-25 LAB — RESP PANEL BY RT-PCR (FLU A&B, COVID) ARPGX2
Influenza A by PCR: NEGATIVE
Influenza B by PCR: NEGATIVE
SARS Coronavirus 2 by RT PCR: NEGATIVE

## 2021-01-25 LAB — BASIC METABOLIC PANEL
Anion gap: 8 (ref 5–15)
BUN: 13 mg/dL (ref 8–23)
CO2: 29 mmol/L (ref 22–32)
Calcium: 9.1 mg/dL (ref 8.9–10.3)
Chloride: 101 mmol/L (ref 98–111)
Creatinine, Ser: 0.76 mg/dL (ref 0.61–1.24)
GFR, Estimated: >60 mL/min (ref >60)
Glucose, Bld: 136 mg/dL — ABNORMAL HIGH (ref 70–99)
Potassium: 4 mmol/L (ref 3.5–5.1)
Sodium: 138 mmol/L (ref 135–145)

## 2021-01-25 LAB — PROCALCITONIN: Procalcitonin: <0.1 ng/mL

## 2021-01-25 LAB — I-STAT ARTERIAL BLOOD GAS, ED
Acid-Base Excess: 1 mmol/L (ref 0.0–2.0)
Bicarbonate: 27 mmol/L (ref 20.0–28.0)
Calcium, Ion: 1.2 mmol/L (ref 1.15–1.40)
HCT: 49 % (ref 39.0–52.0)
Hemoglobin: 16.7 g/dL (ref 13.0–17.0)
O2 Saturation: 100 %
Patient temperature: 98.1
Potassium: 3.4 mmol/L — ABNORMAL LOW (ref 3.5–5.1)
Sodium: 137 mmol/L (ref 135–145)
TCO2: 28 mmol/L (ref 22–32)
pCO2 arterial: 46.4 mmHg (ref 32.0–48.0)
pH, Arterial: 7.371 (ref 7.350–7.450)
pO2, Arterial: 204 mmHg — ABNORMAL HIGH (ref 83.0–108.0)

## 2021-01-25 LAB — CBC
HCT: 47.6 % (ref 39.0–52.0)
Hemoglobin: 15.8 g/dL (ref 13.0–17.0)
MCH: 32.2 pg (ref 26.0–34.0)
MCHC: 33.2 g/dL (ref 30.0–36.0)
MCV: 96.9 fL (ref 80.0–100.0)
Platelets: 175 10*3/uL (ref 150–400)
RBC: 4.91 MIL/uL (ref 4.22–5.81)
RDW: 12 % (ref 11.5–15.5)
WBC: 14.2 10*3/uL — ABNORMAL HIGH (ref 4.0–10.5)
nRBC: 0 % (ref 0.0–0.2)

## 2021-01-25 LAB — TROPONIN I (HIGH SENSITIVITY): Troponin I (High Sensitivity): 4 ng/L (ref <18)

## 2021-01-25 MED ORDER — LORAZEPAM 2 MG/ML IJ SOLN
INTRAMUSCULAR | Status: AC
  Administered 2021-01-25: 1 mg via INTRAVASCULAR
  Filled 2021-01-25: qty 1

## 2021-01-25 MED ORDER — IPRATROPIUM BROMIDE 0.02 % IN SOLN
0.5000 mg | Freq: Once | RESPIRATORY_TRACT | Status: AC
  Administered 2021-01-25: 0.5 mg via RESPIRATORY_TRACT
  Filled 2021-01-25: qty 2.5

## 2021-01-25 MED ORDER — MAGNESIUM SULFATE 2 GM/50ML IV SOLN
2.0000 g | Freq: Once | INTRAVENOUS | Status: AC
  Administered 2021-01-25: 2 g via INTRAVENOUS
  Filled 2021-01-25: qty 50

## 2021-01-25 MED ORDER — METHYLPREDNISOLONE SODIUM SUCC 125 MG IJ SOLR
125.0000 mg | Freq: Once | INTRAMUSCULAR | Status: AC
  Administered 2021-01-25: 125 mg via INTRAVENOUS
  Filled 2021-01-25: qty 2

## 2021-01-25 MED ORDER — ALBUTEROL (5 MG/ML) CONTINUOUS INHALATION SOLN
10.0000 mg/h | INHALATION_SOLUTION | Freq: Once | RESPIRATORY_TRACT | Status: AC
  Administered 2021-01-25: 10 mg/h via RESPIRATORY_TRACT
  Filled 2021-01-25: qty 20

## 2021-01-25 MED ORDER — ALBUTEROL SULFATE (2.5 MG/3ML) 0.083% IN NEBU
5.0000 mg | INHALATION_SOLUTION | Freq: Once | RESPIRATORY_TRACT | Status: AC
  Administered 2021-01-25: 5 mg via RESPIRATORY_TRACT
  Filled 2021-01-25: qty 6

## 2021-01-25 MED ORDER — DOXYCYCLINE HYCLATE 100 MG PO TABS
100.0000 mg | ORAL_TABLET | Freq: Once | ORAL | Status: AC
  Administered 2021-01-25: 100 mg via ORAL
  Filled 2021-01-25: qty 1

## 2021-01-25 MED ORDER — LORAZEPAM 2 MG/ML IJ SOLN: 1.0000 mg | Freq: Once | INTRAMUSCULAR | Status: AC

## 2021-01-25 MED ORDER — ALBUTEROL (5 MG/ML) CONTINUOUS INHALATION SOLN
10.0000 mg/h | INHALATION_SOLUTION | Freq: Once | RESPIRATORY_TRACT | Status: AC
  Administered 2021-01-25: 10 mg/h via RESPIRATORY_TRACT

## 2021-01-25 NOTE — ED Notes (Addendum)
RT obtained ABG on pt with the following results. RT decreased FIO2 from 40% to 30% at this time based on PaO2 results. RT will continue to monitor.   Results for George Collins, George Collins (MRN [id number]) as of 01/25/2021 22:30  Ref. Range 01/25/2021 22:09  Sample type Unknown ARTERIAL  pH, Arterial Latest Ref Range: 7.350 - 7.450  7.371  pCO2 arterial Latest Ref Range: 32.0 - 48.0 mmHg 46.4  pO2, Arterial Latest Ref Range: 83.0 - 108.0 mmHg 204 (H)  TCO2 Latest Ref Range: 22 - 32 mmol/L 28  Acid-Base Excess Latest Ref Range: 0.0 - 2.0 mmol/L 1.0  Bicarbonate Latest Ref Range: 20.0 - 28.0 mmol/L 27.0  O2 Saturation Latest Units: % 100.0  Patient temperature Unknown 98.1 F  Collection site Unknown Radial

## 2021-01-25 NOTE — ED Notes (Signed)
Patient resting comfortable with BPAP at present. Fan provided for comfort.

## 2021-01-25 NOTE — ED Notes (Signed)
Patient states comfortable on BPAP.  Breathing unlaboured.

## 2021-01-25 NOTE — ED Notes (Addendum)
RT placed pt on NIV/BIPAP after an hour long neb, pt having more difficulty breathing labored and pt tripoding stating he cannot breathing. Pt pulling BIPAP off. RT able to get pt back on MD ordered medication to help pt relax. Pt respiratory status moderately stable w/some distress noted at this time. RT will continue to monitor.

## 2021-01-25 NOTE — ED Notes (Addendum)
RT educated pt on importance of smoking cessation and various methods of stopping smoking along with the benefits of stopping. Pt also educated on COPD management and how smoking effects pts progression of the disease. Pt states he only smokes 1 pack a week. Pt states he is done with smoking that this episode has scared him straight. RT offered to answer any question the pt may have about current visit or any respiratory related information. Pt states he is good. RT will continue to monitor.

## 2021-01-25 NOTE — ED Notes (Addendum)
Pt resting comfortably on BIPAP/NIV after admin of medication for anxiety. Pt respiratory status stable at this time on BIPAP/NIV w/no distress noted. RT will continue to monitor.

## 2021-01-25 NOTE — ED Provider Notes (Signed)
[facility] EMERGENCY DEPT Provider Note   CSN: [id number] Arrival date & time: 01/25/21  1241     History Chief Complaint  Patient presents with   Shortness of Breath    George Collins is a 75 y.o. male.  HPI 75 year old male presents with shortness of breath.  Does seem like this started around 4 AM.  EMS was called but he did not seem like he wanted to be transported and then his symptoms have progressively worsened throughout the day.  He is having a cough with white sputum.  He denies any chest pain or fevers.  No leg swelling.  He has had issues like this before.  He has had some wheezing but his albuterol inhaler does not seem to be helping.  Past Medical History:  Diagnosis Date   Diabetes mellitus without complication (HCC)    Personal history of rectal adenoma 08/09/2009    Patient Active Problem List   Diagnosis Date Noted   COPD exacerbation (HCC) 01/25/2021   Shortness of breath    Abnormal CT scan of heart    Personal history of rectal adenoma 08/09/2009    Past Surgical History:  Procedure Laterality Date   BACK SURGERY     NECK SURGERY     RIGHT/LEFT HEART CATH AND CORONARY ANGIOGRAPHY N/A 06/19/2020   Procedure: RIGHT/LEFT HEART CATH AND CORONARY ANGIOGRAPHY;  Surgeon: Corky Crafts, MD;  Location: [facility] INVASIVE CV LAB;  Service: Cardiovascular;  Laterality: N/A;       No family history on file.  Social History   Tobacco Use   Smoking status: Every Day   Smokeless tobacco: Never  Substance Use Topics   Alcohol use: Yes    Comment: occ   Drug use: No    Home Medications Prior to Admission medications   Medication Sig Start Date End Date Taking? Authorizing Provider  acetaminophen (TYLENOL) 500 MG tablet Take 500 mg by mouth every 6 (six) hours as needed for moderate pain or headache.    [provider]  albuterol (VENTOLIN HFA) 108 (90 Base) MCG/ACT inhaler Inhale 2 puffs into the lungs every 6 (six) hours as needed  for wheezing or shortness of breath.    [provider]  aspirin EC 81 MG tablet Take 81 mg by mouth daily. Swallow whole.    [provider]  atorvastatin (LIPITOR) 40 MG tablet Take 1 tablet (40 mg total) by mouth daily. 06/13/20   Jake Bathe, MD  budesonide-formoterol (SYMBICORT) 160-4.5 MCG/ACT inhaler Inhale 2 puffs into the lungs 2 (two) times daily.    [provider]  melatonin 3 MG TABS tablet Take 3 mg by mouth at bedtime.    [provider]  metFORMIN (GLUCOPHAGE) 1000 MG tablet Take 0.5 tablets (500 mg total) by mouth 2 (two) times daily. 06/21/20   Corky Crafts, MD  mirtazapine (REMERON) 15 MG tablet Take 15 mg by mouth at bedtime.    [provider]    Allergies    Patient has no known allergies.  Review of Systems   Review of Systems  Constitutional:  Negative for fever.  Respiratory:  Positive for cough and shortness of breath.   Cardiovascular:  Negative for chest pain and leg swelling.  Gastrointestinal:  Negative for vomiting.  All other systems reviewed and are negative.  Physical Exam Updated Vital Signs BP (!) 158/67   Pulse (!) 111   Temp 98.1 F (36.7 C)   Resp (!) 22  SpO2 100%   Physical Exam Vitals and nursing note reviewed.  Constitutional:      Appearance: He is well-developed. He is not diaphoretic.  HENT:     Head: Normocephalic and atraumatic.     Right Ear: External ear normal.     Left Ear: External ear normal.     Nose: Nose normal.  Eyes:     General:        Right eye: No discharge.        Left eye: No discharge.  Cardiovascular:     Rate and Rhythm: Normal rate and regular rhythm.     Heart sounds: Normal heart sounds.  Pulmonary:     Effort: Tachypnea and accessory muscle usage present. No respiratory distress.     Breath sounds: Decreased breath sounds and wheezing (slight, expiratory) present.  Abdominal:     Palpations: Abdomen is soft.     Tenderness: There is no  abdominal tenderness.  Musculoskeletal:     Cervical back: Neck supple.     Right lower leg: No edema.     Left lower leg: No edema.  Skin:    General: Skin is warm and dry.  Neurological:     Mental Status: He is alert.  Psychiatric:        Mood and Affect: Mood is not anxious.    ED Results / Procedures / Treatments   Labs (all labs ordered are listed, but only abnormal results are displayed) Labs Reviewed  BASIC METABOLIC PANEL - Abnormal; Notable for the following components:      Result Value   Glucose, Bld 136 (*)    All other components within normal limits  CBC - Abnormal; Notable for the following components:   WBC 14.2 (*)    All other components within normal limits  I-STAT ARTERIAL BLOOD GAS, ED - Abnormal; Notable for the following components:   pO2, Arterial 204 (*)    Potassium 3.4 (*)    All other components within normal limits  RESP PANEL BY RT-PCR (FLU A&B, COVID) ARPGX2  PROCALCITONIN  TROPONIN I (HIGH SENSITIVITY)    EKG EKG Interpretation  Date/Time:  Friday January 25 2021 12:53:49 EDT Ventricular Rate:  77 PR Interval:  152 QRS Duration: 82 QT Interval:  374 QTC Calculation: 423 R Axis:   90 Text Interpretation: Normal sinus rhythm Rightward axis Septal infarct , age undetermined No old tracing to compare Confirmed by Pricilla Loveless [id number]) on 01/25/2021 3:57:21 PM  Radiology DG Chest 2 View  Result Date: 01/25/2021 CLINICAL DATA:  Chest pain.  Shortness of breath. EXAM: CHEST - 2 VIEW COMPARISON:  Chest radiographs 08/27/2009.  Cardiac CTA 06/11/2020. FINDINGS: The cardiomediastinal silhouette is unchanged with normal heart size. Aortic atherosclerosis is noted. The lungs remain hyperinflated with underlying emphysema. No airspace consolidation, edema, pleural effusion, pneumothorax is identified. No acute osseous abnormality is seen. IMPRESSION: COPD without evidence of acute cardiopulmonary process. Electronically Signed   By: Sebastian Ache  M.D.   On: 01/25/2021 13:35    Procedures .Critical Care Performed by: Pricilla Loveless, MD Authorized by: Pricilla Loveless, MD   Critical care provider statement:    Critical care time (minutes):  60   Critical care time was exclusive of:  Separately billable procedures and treating other patients   Critical care was necessary to treat or prevent imminent or life-threatening deterioration of the following conditions:  Respiratory failure   Critical care was time spent personally by me on the following activities:  Discussions with consultants, evaluation of patient's response to treatment, examination of patient, ordering and performing treatments and interventions, ordering and review of laboratory studies, ordering and review of radiographic studies, pulse oximetry, re-evaluation of patient's condition, obtaining history from patient or surrogate and review of old charts   Medications Ordered in ED Medications  albuterol (PROVENTIL) (2.5 MG/3ML) 0.083% nebulizer solution 5 mg (5 mg Nebulization Given 01/25/21 1632)  ipratropium (ATROVENT) nebulizer solution 0.5 mg (0.5 mg Nebulization Given 01/25/21 1632)  methylPREDNISolone sodium succinate (SOLU-MEDROL) 125 mg/2 mL injection 125 mg (125 mg Intravenous Given 01/25/21 1618)  albuterol (PROVENTIL) (2.5 MG/3ML) 0.083% nebulizer solution 5 mg (5 mg Nebulization Given 01/25/21 1824)  ipratropium (ATROVENT) nebulizer solution 0.5 mg (0.5 mg Nebulization Given 01/25/21 1824)  albuterol (PROVENTIL,VENTOLIN) solution continuous neb (10 mg/hr Nebulization Given 01/25/21 1941)  magnesium sulfate IVPB 2 g 50 mL (0 g Intravenous Stopped 01/25/21 2100)  doxycycline (VIBRA-TABS) tablet 100 mg (100 mg Oral Given 01/25/21 1959)  albuterol (PROVENTIL,VENTOLIN) solution continuous neb (10 mg/hr Nebulization Given 01/25/21 2045)  LORazepam (ATIVAN) injection 1 mg (1 mg Intravascular Given 01/25/21 2125)    ED Course  I have reviewed the triage vital signs and the nursing  notes.  Pertinent labs & imaging results that were available during my care of the patient were reviewed by me and considered in my medical decision making (see chart for details).    MDM Rules/Calculators/A&P                           Patient presents with COPD exacerbation.  He has minimally improved with some albuterol treatments and then was given a continuous.  He was given magnesium and Solu-Medrol.  No obvious infection.  He was given some oral doxycycline for COPD exacerbation but then he has progressively worsened.  At 1 point he got put on BiPAP.  While on BiPAP he all of a sudden became very hot and complaining he could not breathe and took the BiPAP off.  There was some concern that with some agitation he may need to be intubated but after some small dose of Ativan, he was able to be calm down and put back on BiPAP and now is relaxing comfortably.  ABG while on BiPAP is good.  I do not think he is going to need intubation.  However he will need to be watched closely and so I did update the hospitalist and he will be admitted to the stepdown unit. Final Clinical Impression(s) / ED Diagnoses Final diagnoses:  COPD exacerbation (HCC)  Acute respiratory failure, unspecified whether with hypoxia or hypercapnia [facility])    Rx / DC Orders ED Discharge Orders     None        Pricilla Loveless, MD 01/25/21 2321

## 2021-01-25 NOTE — ED Triage Notes (Signed)
Patient reports to the ER for Shortness of breath. Patient reports he called EMS this morning and was not transported at that time and was told if he got worse to go to the ER

## 2021-01-25 NOTE — ED Notes (Addendum)
Patient back to room.  C/O short of breath.  Struggling to breath.  Hyperventilating.  Unable to sit back in bed.  Yelling out at times and grabbing chest; tearing off leads; stating can't breath.  States has no pain.  Taking off clothes stating they restrict him.  Onset of SOB 0400 this am.  Pulse ox on room air 92%.  Encouraged to breath through nose and push out through mouth to slow breathing down.  Dr. Criss Alvine notified.

## 2021-01-25 NOTE — ED Notes (Signed)
Patient calling out.  States feels hot  Taking off CPAP states can't breath.  RT and MD at bedside to assess

## 2021-01-25 NOTE — ED Notes (Signed)
Patient appears calmer and less labour breathing post breathing treatment

## 2021-01-25 NOTE — ED Notes (Signed)
ED Provider at bedside. 

## 2021-01-26 ENCOUNTER — Encounter (HOSPITAL_COMMUNITY): Payer: Self-pay | Admitting: Family Medicine

## 2021-01-26 DIAGNOSIS — E785 Hyperlipidemia, unspecified: Secondary | ICD-10-CM

## 2021-01-26 DIAGNOSIS — Z20822 Contact with and (suspected) exposure to covid-19: Secondary | ICD-10-CM | POA: Diagnosis present

## 2021-01-26 DIAGNOSIS — J441 Chronic obstructive pulmonary disease with (acute) exacerbation: Principal | ICD-10-CM

## 2021-01-26 DIAGNOSIS — Z720 Tobacco use: Secondary | ICD-10-CM

## 2021-01-26 DIAGNOSIS — D72829 Elevated white blood cell count, unspecified: Secondary | ICD-10-CM | POA: Diagnosis not present

## 2021-01-26 DIAGNOSIS — F17210 Nicotine dependence, cigarettes, uncomplicated: Secondary | ICD-10-CM | POA: Diagnosis present

## 2021-01-26 DIAGNOSIS — E119 Type 2 diabetes mellitus without complications: Secondary | ICD-10-CM | POA: Diagnosis present

## 2021-01-26 DIAGNOSIS — Z85048 Personal history of other malignant neoplasm of rectum, rectosigmoid junction, and anus: Secondary | ICD-10-CM | POA: Diagnosis not present

## 2021-01-26 DIAGNOSIS — Z79899 Other long term (current) drug therapy: Secondary | ICD-10-CM | POA: Diagnosis not present

## 2021-01-26 DIAGNOSIS — Z7984 Long term (current) use of oral hypoglycemic drugs: Secondary | ICD-10-CM | POA: Diagnosis not present

## 2021-01-26 DIAGNOSIS — J9601 Acute respiratory failure with hypoxia: Secondary | ICD-10-CM | POA: Diagnosis present

## 2021-01-26 DIAGNOSIS — Z7951 Long term (current) use of inhaled steroids: Secondary | ICD-10-CM | POA: Diagnosis not present

## 2021-01-26 DIAGNOSIS — J9600 Acute respiratory failure, unspecified whether with hypoxia or hypercapnia: Secondary | ICD-10-CM | POA: Diagnosis present

## 2021-01-26 DIAGNOSIS — Z23 Encounter for immunization: Secondary | ICD-10-CM | POA: Diagnosis present

## 2021-01-26 HISTORY — DX: Type 2 diabetes mellitus without complications: E11.9

## 2021-01-26 HISTORY — DX: Hyperlipidemia, unspecified: E78.5

## 2021-01-26 LAB — HEMOGLOBIN A1C
Hgb A1c MFr Bld: 5.2 % (ref 4.8–5.6)
Mean Plasma Glucose: 102.54 mg/dL

## 2021-01-26 LAB — GLUCOSE, CAPILLARY
Glucose-Capillary: 178 mg/dL — ABNORMAL HIGH (ref 70–99)
Glucose-Capillary: 173 mg/dL — ABNORMAL HIGH (ref 70–99)
Glucose-Capillary: 112 mg/dL — ABNORMAL HIGH (ref 70–99)
Glucose-Capillary: 130 mg/dL — ABNORMAL HIGH (ref 70–99)

## 2021-01-26 MED ORDER — ONDANSETRON HCL 4 MG PO TABS: 4.0000 mg | ORAL_TABLET | Freq: Four times a day (QID) | ORAL | Status: DC | PRN

## 2021-01-26 MED ORDER — MELATONIN 3 MG PO TABS
3.0000 mg | ORAL_TABLET | Freq: Every day | ORAL | Status: DC
  Administered 2021-01-26 – 2021-01-27 (×2): 3 mg via ORAL
  Filled 2021-01-26 (×2): qty 1

## 2021-01-26 MED ORDER — MIRTAZAPINE 15 MG PO TABS
15.0000 mg | ORAL_TABLET | Freq: Every day | ORAL | Status: DC
  Administered 2021-01-26 – 2021-01-27 (×2): 15 mg via ORAL
  Filled 2021-01-26 (×2): qty 1

## 2021-01-26 MED ORDER — ENOXAPARIN SODIUM 40 MG/0.4ML IJ SOSY
40.0000 mg | PREFILLED_SYRINGE | Freq: Every day | INTRAMUSCULAR | Status: DC
  Administered 2021-01-26 – 2021-01-28 (×3): 40 mg via SUBCUTANEOUS
  Filled 2021-01-26 (×3): qty 0.4

## 2021-01-26 MED ORDER — DOXYCYCLINE HYCLATE 100 MG PO TABS
100.0000 mg | ORAL_TABLET | Freq: Two times a day (BID) | ORAL | Status: DC
  Administered 2021-01-26 – 2021-01-28 (×5): 100 mg via ORAL
  Filled 2021-01-26 (×5): qty 1

## 2021-01-26 MED ORDER — INSULIN ASPART 100 UNIT/ML IJ SOLN
0.0000 [IU] | Freq: Three times a day (TID) | INTRAMUSCULAR | Status: DC
  Administered 2021-01-26 (×2): 3 [IU] via SUBCUTANEOUS
  Administered 2021-01-27 – 2021-01-28 (×4): 2 [IU] via SUBCUTANEOUS
  Administered 2021-01-28: 3 [IU] via SUBCUTANEOUS

## 2021-01-26 MED ORDER — ASPIRIN EC 81 MG PO TBEC
81.0000 mg | DELAYED_RELEASE_TABLET | Freq: Every day | ORAL | Status: DC
  Administered 2021-01-26 – 2021-01-28 (×3): 81 mg via ORAL
  Filled 2021-01-26 (×3): qty 1

## 2021-01-26 MED ORDER — ACETAMINOPHEN 325 MG PO TABS: 650.0000 mg | ORAL_TABLET | Freq: Four times a day (QID) | ORAL | Status: DC | PRN

## 2021-01-26 MED ORDER — PREDNISONE 20 MG PO TABS
40.0000 mg | ORAL_TABLET | Freq: Every day | ORAL | Status: DC
Start: 2021-01-30 — End: 2021-01-27

## 2021-01-26 MED ORDER — ONDANSETRON HCL 4 MG/2ML IJ SOLN: 4.0000 mg | Freq: Four times a day (QID) | INTRAMUSCULAR | Status: DC | PRN

## 2021-01-26 MED ORDER — ACETAMINOPHEN 650 MG RE SUPP: 650.0000 mg | Freq: Four times a day (QID) | RECTAL | Status: DC | PRN

## 2021-01-26 MED ORDER — METFORMIN HCL 500 MG PO TABS
500.0000 mg | ORAL_TABLET | Freq: Two times a day (BID) | ORAL | Status: DC
  Administered 2021-01-26 – 2021-01-28 (×5): 500 mg via ORAL
  Filled 2021-01-26 (×5): qty 1

## 2021-01-26 MED ORDER — METHYLPREDNISOLONE SODIUM SUCC 40 MG IJ SOLR
40.0000 mg | Freq: Two times a day (BID) | INTRAMUSCULAR | Status: DC
  Administered 2021-01-26 – 2021-01-27 (×3): 40 mg via INTRAVENOUS
  Filled 2021-01-26 (×3): qty 1

## 2021-01-26 MED ORDER — IPRATROPIUM-ALBUTEROL 0.5-2.5 (3) MG/3ML IN SOLN
3.0000 mL | Freq: Four times a day (QID) | RESPIRATORY_TRACT | Status: DC
  Administered 2021-01-26 – 2021-01-28 (×8): 3 mL via RESPIRATORY_TRACT
  Filled 2021-01-26 (×8): qty 3

## 2021-01-26 NOTE — ED Notes (Signed)
Handoff report given to [facility] on [address]

## 2021-01-26 NOTE — ED Notes (Signed)
Attempt report.  Floor RN to call back 

## 2021-01-26 NOTE — Plan of Care (Signed)
?  Problem: Education: ?Goal: Knowledge of disease or condition will improve ?Outcome: Progressing ?Goal: Knowledge of the prescribed therapeutic regimen will improve ?Outcome: Progressing ?Goal: Individualized Educational Video(s) ?Outcome: Progressing ?  ?Problem: Activity: ?Goal: Ability to tolerate increased activity will improve ?Outcome: Progressing ?Goal: Will verbalize the importance of balancing activity with adequate rest periods ?Outcome: Progressing ?  ?

## 2021-01-26 NOTE — ED Notes (Addendum)
Called [facility] to transport patient to [facility] 5W room 33

## 2021-01-26 NOTE — Progress Notes (Signed)
SATURATION QUALIFICATIONS: (This note is used to comply with regulatory documentation for home oxygen)  Patient Saturations on Room Air at Rest = 92%  Patient Saturations on Room Air while Ambulating = 80%. When stood up, desat  Patient Saturations on 4  Liters of oxygen while Ambulating 90%  Please briefly explain why patient needs home oxygen: Pt was sitting on 1L 96--> RA 90-->stood up Desat to 76-80, accessory muscles use-->2->4L Ambulated 50 feet on 4L sats in 88. Came back, sit dow--> O2 96 immidietly. O2 removed. --> RA 92%  Using Incentive spirometer (750) and Flutter valve  MD notified and aware

## 2021-01-26 NOTE — ED Notes (Signed)
Patient off BPAP.  Resting comfortably unlabored respirations.  Tolerating well.

## 2021-01-26 NOTE — H&P (Signed)
History and Physical    George Collins:[id number] DOB: June 14, 1945 DOA: 01/25/2021  PCP: Shon Hale, MD   Patient coming from: [place] [facility].   I have personally briefly reviewed patient's old medical records in [facility]  Chief Complaint: Shortness of breath.  HPI: George Collins is a 75 y.o. male with medical history significant of hyperlipidemia, rectal adenoma, type 2 diabetes, tobacco use who is [facility] where he presented with progressively worse dyspnea, wheezing and whitish productive sputum cough for the last few days after initially having headache, rhinorrhea and sore throat.  He denied any travel history or sick contact exposure.  Denied hemoptysis but have pleuritic chest pain.  No typical chest pain, palpitations, diaphoresis, PND, orthopnea or recent pitting edema of the lower extremities.  Denied abdominal pain, nausea, vomiting, diarrhea, constipation, melena or hematochezia.  No dysuria, frequency or hematuria.  No polyuria, polydipsia, polyphagia or blurred vision.  ED Course: Initial vital signs were temperature 98.1 F, pulse 79, respiration 18, BP 130/100 mmHg and O2 sat 93% on room air.  While the patient was seen in the ER he had to be placed on BiPAP with a 40% FiO2.  He also received bronchodilators, methylprednisolone, doxycycline 100 mg p.o. x1, magnesium sulfate 2 g IVPB and lorazepam 1 mg IVP after he was placed on BiPAP.  Lab work: ABG was normal except for a PO2 of 204 mmHg and potassium of 3.4 mmol/L while on BiPAP with 40% FiO2, CBC showed a white count of 14.2, hemoglobin 15.9 g/dL platelets 563.  BMP glucose of 136 mg/dL, but otherwise unremarkable.  Troponin x1 and procalcitonin were normal.  His chest radiograph shows COPD with no acute cardiopulmonary pathology.  Review of Systems: As per HPI otherwise all other systems reviewed and are negative.  Past Medical History:  Diagnosis Date    Hyperlipidemia 01/26/2021   Personal history of rectal adenoma 08/09/2009   Type 2 diabetes mellitus (HCC) 01/26/2021   Past Surgical History:  Procedure Laterality Date   BACK SURGERY     NECK SURGERY     RIGHT/LEFT HEART CATH AND CORONARY ANGIOGRAPHY N/A 06/19/2020   Procedure: RIGHT/LEFT HEART CATH AND CORONARY ANGIOGRAPHY;  Surgeon: Corky Crafts, MD;  Location: [facility] INVASIVE CV LAB;  Service: Cardiovascular;  Laterality: N/A;   Social History  reports that he has been smoking. He has never used smokeless tobacco. He reports current alcohol use. He reports that he does not use drugs.  No Known Allergies  Family History  Problem Relation Age of Onset   Hypertension Other    Prior to Admission medications   Medication Sig Start Date End Date Taking? Authorizing Provider  acetaminophen (TYLENOL) 500 MG tablet Take 500 mg by mouth every 6 (six) hours as needed for moderate pain or headache.    [provider]  albuterol (VENTOLIN HFA) 108 (90 Base) MCG/ACT inhaler Inhale 2 puffs into the lungs every 6 (six) hours as needed for wheezing or shortness of breath.    [provider]  aspirin EC 81 MG tablet Take 81 mg by mouth daily. Swallow whole.    [provider]  atorvastatin (LIPITOR) 40 MG tablet Take 1 tablet (40 mg total) by mouth daily. 06/13/20   Jake Bathe, MD  budesonide-formoterol (SYMBICORT) 160-4.5 MCG/ACT inhaler Inhale 2 puffs into the lungs 2 (two) times daily.    [provider]  melatonin 3 MG TABS tablet Take 3 mg  by mouth at bedtime.    [provider]  metFORMIN (GLUCOPHAGE) 1000 MG tablet Take 0.5 tablets (500 mg total) by mouth 2 (two) times daily. 06/21/20   Corky Crafts, MD  mirtazapine (REMERON) 15 MG tablet Take 15 mg by mouth at bedtime.    [provider]   Physical Exam: Vitals:   01/25/21 2314 01/26/21 0025 01/26/21 0133 01/26/21 0315  BP: (!) 158/67  (!) 155/75   Pulse: (!) 111  (!) 102 91   Resp: (!) 22  (!) 24   Temp:    98.2 F (36.8 C)  TempSrc:    Oral  SpO2: 100% (!) 89% 92% 96%   Constitutional: NAD, calm, comfortable Eyes: PERRL, lids and conjunctivae normal.  Injected sclera. ENMT: Nasal cannula in place.  Mucous membranes are moist. Posterior pharynx clear of any exudate or lesions. Neck: normal, supple, no masses, no thyromegaly Respiratory: Decreased breath sounds with bilateral rhonchi and wheezing, no crackles.  No accessory muscle use.  Cardiovascular: Regular rate and rhythm, no murmurs / rubs / gallops. No extremity edema. 2+ pedal pulses. No carotid bruits.  Abdomen: No distention.  Bowel sounds positive.  Soft, no tenderness, no masses palpated. No hepatosplenomegaly.  Musculoskeletal: Mild generalized weakness.  No clubbing / cyanosis. Good ROM, no contractures. Normal muscle tone.  Skin: no acute rashes, lesions, ulcers on very limited otological examination. Neurologic: CN 2-12 grossly intact. Sensation intact, DTR normal. Strength 5/5 in all 4.  Psychiatric: Normal judgment and insight. Alert and oriented x 3. Normal mood.   Labs on Admission: I have personally reviewed following labs and imaging studies  CBC: Recent Labs  Lab 01/25/21 1256 01/25/21 2209  WBC 14.2*  --   HGB 15.8 16.7  HCT 47.6 49.0  MCV 96.9  --   PLT 175  --     Basic Metabolic Panel: Recent Labs  Lab 01/25/21 1256 01/25/21 2209  NA 138 137  K 4.0 3.4*  CL 101  --   CO2 29  --   GLUCOSE 136*  --   BUN 13  --   CREATININE 0.76  --   CALCIUM 9.1  --     GFR: CrCl cannot be calculated (Unknown ideal weight.).  Liver Function Tests: No results for input(s): AST, ALT, ALKPHOS, BILITOT, PROT, ALBUMIN in the last 168 hours.  Urine analysis: No results found for: COLORURINE, APPEARANCEUR, LABSPEC, PHURINE, GLUCOSEU, HGBUR, BILIRUBINUR, KETONESUR, PROTEINUR, UROBILINOGEN, NITRITE, LEUKOCYTESUR  Radiological Exams on Admission: DG Chest 2 View  Result Date:  01/25/2021 CLINICAL DATA:  Chest pain.  Shortness of breath. EXAM: CHEST - 2 VIEW COMPARISON:  Chest radiographs 08/27/2009.  Cardiac CTA 06/11/2020. FINDINGS: The cardiomediastinal silhouette is unchanged with normal heart size. Aortic atherosclerosis is noted. The lungs remain hyperinflated with underlying emphysema. No airspace consolidation, edema, pleural effusion, pneumothorax is identified. No acute osseous abnormality is seen. IMPRESSION: COPD without evidence of acute cardiopulmonary process. Electronically Signed   By: Sebastian Ache M.D.   On: 01/25/2021 13:35    EKG: Independently reviewed.  Vent. rate 77 BPM PR interval 152 ms QRS duration 82 ms QT/QTcB 374/423 ms P-R-T axes 88 90 82 Normal sinus rhythm Rightward axis Septal infarct , age undetermined  Assessment/Plan Principal Problem:   Acute respiratory failure with hypoxia (HCC) In the setting of   COPD exacerbation (HCC) Continue supplemental oxygen. Continue bronchodilators. Continue BiPAP as needed. Methylprednisolone 40 mg IVP every 12 hours. Began prednisone taper afterwards.  Active Problems:  Type 2 diabetes mellitus (HCC) Carbohydrate modified diet. Continue metformin 500 mg p.o. twice daily. CBG monitoring with RI SS.    Leukocytosis Stress? Viral illness? Glucocorticoids? Follow-up WBC.    Hyperlipidemia Not on statin.    Tobacco abuse Committed to quit. Declined nicotine replacement therapy.   DVT prophylaxis: Lovenox SQ. Code Status:  Full code. Family Communication:   Disposition Plan:   Patient is from:  Home.  Anticipated DC to:  Home.  Anticipated DC date:  01/27/2021.  Anticipated DC barriers: Clinical status.  Consults called:   Admission status:  Observation/PCU.  Severity of Illness:  High severity after presenting with acute respiratory failure with hypoxia in the setting of COPD exacerbation requiring BiPAP ventilation mode in addition to standard medical treatment.  Bobette Mo MD [facility]  How to contact the [facility] Attending or Consulting provider 7A - 7P or covering provider during after hours 7P -7A, for this patient?   Check the care team in [facility] and look for a) attending/consulting [facility] provider listed and b) the [facility] team listed Log into [email or web address] and use [place]'s universal password to access. If you do not have the password, please contact the hospital operator. Locate the [facility] provider you are looking for under [facility] and page to a number that you can be directly reached. If you still have difficulty reaching the provider, please page the [facility] (Director on Call) for the Hospitalists listed on amion for assistance.  01/26/2021, 3:34 AM   This document was prepared using Dragon voice recognition software and may contain some unintended transcription errors.

## 2021-01-26 NOTE — Progress Notes (Signed)
PROGRESS NOTE    George Collins  XFG:[id number] DOB: 1945-12-15 DOA: 01/25/2021 PCP: Shon Hale, MD   Chief Complaint  Patient presents with   Shortness of Breath    Brief Narrative:    This is a no charge note as patient was seen and admitted earlier today by Dr. Robb Matar, chart, imaging and labs were reviewed, patient was seen and examined.   HPI: George Collins is a 75 y.o. male with medical history significant of hyperlipidemia, rectal adenoma, type 2 diabetes, tobacco use who is [facility] where he presented with progressively worse dyspnea, wheezing and whitish productive sputum cough for the last few days after initially having headache, rhinorrhea and sore throat.  He denied any travel history or sick contact exposure.  Denied hemoptysis but have pleuritic chest pain.  No typical chest pain, palpitations, diaphoresis, PND, orthopnea or recent pitting edema of the lower extremities.  Denied abdominal pain, nausea, vomiting, diarrhea, constipation, melena or hematochezia.  No dysuria, frequency or hematuria.  No polyuria, polydipsia, polyphagia or blurred vision.   ED Course: Initial vital signs were temperature 98.1 F, pulse 79, respiration 18, BP 130/100 mmHg and O2 sat 93% on room air.  While the patient was seen in the ER he had to be placed on BiPAP with a 40% FiO2.  He also received bronchodilators, methylprednisolone, doxycycline 100 mg p.o. x1, magnesium sulfate 2 g IVPB and lorazepam 1 mg IVP after he was placed on BiPAP.   Lab work: ABG was normal except for a PO2 of 204 mmHg and potassium of 3.4 mmol/L while on BiPAP with 40% FiO2, CBC showed a white count of 14.2, hemoglobin 15.9 g/dL platelets 967.  BMP glucose of 136 mg/dL, but otherwise unremarkable.  Troponin x1 and procalcitonin were normal.  His chest radiograph shows COPD with no acute cardiopulmonary pathology.     Assessment & Plan:   Principal Problem:   COPD  exacerbation (HCC) Active Problems:   Type 2 diabetes mellitus (HCC)   Leukocytosis   Hyperlipidemia   Acute respiratory failure (HCC)   Tobacco abuse  Acute respiratory failure with hypoxia (HCC) -Due to sepsis or [place], he is hypoxic with activity, and intermittently on room air at rest, continue with oxygen and wean as tolerated    COPD exacerbation (HCC) Continue supplemental oxygen. Continue bronchodilators. Continue BiPAP as needed. Methylprednisolone 40 mg IVP every 12 hours. Began prednisone taper afterwards. -Continue with IV steroids, will add doxycycline given some cough with a productive of phlegm, I have encouraged him to use incentive spirometry and flutter valve.   Active Problems:   Type 2 diabetes mellitus (HCC) Carbohydrate modified diet. Continue metformin 500 mg p.o. twice daily. CBG monitoring with RI SS.     Leukocytosis Stress? Viral illness? Glucocorticoids? Follow-up WBC.     Hyperlipidemia Not on statin.     Tobacco abuse Committed to quit. Declined nicotine replacement therapy   DVT prophylaxis: Lovenox Code Status: Full Family Communication: None at bedside Disposition:   Status is: Inpatient  Remains inpatient appropriate because:IV treatments appropriate due to intensity of illness or inability to take PO  Dispo: The patient is from: Home              Anticipated d/c is to: Home              Patient currently is not medically stable to d/c.   Difficult to place patient No  Consultants:  None  Subjective:  Patient still reports some dyspnea, he had desaturation event earlier today, he is around 86% on room air with activity, required up to 3 L today, her saturation went up to 91%.  Objective: Vitals:   01/26/21 0900 01/26/21 1100 01/26/21 1149 01/26/21 1152  BP:    (!) 121/52  Pulse:    84  Resp: (!) 21  (!) 22 17  Temp:    98.5 F (36.9 C)  TempSrc:    Oral  SpO2: 92% (!) 89% 91% 90%    Intake/Output  Summary (Last 24 hours) at 01/26/2021 1343 Last data filed at 01/26/2021 0956 Gross per 24 hour  Intake 350 ml  Output 250 ml  Net 100 ml   There were no vitals filed for this visit.  Examination:  Awake Alert, Oriented X 3, No new F.N deficits, Normal affect Symmetrical Chest wall movement, diminished air entry bilaterally with wheezing RRR,No Gallops,Rubs or new Murmurs, No Parasternal Heave +ve B.Sounds, Abd Soft, No tenderness, No rebound - guarding or rigidity. No Cyanosis, Clubbing or edema, No new Rash or bruise       Data Reviewed: I have personally reviewed following labs and imaging studies  CBC: Recent Labs  Lab 01/25/21 1256 01/25/21 2209  WBC 14.2*  --   HGB 15.8 16.7  HCT 47.6 49.0  MCV 96.9  --   PLT 175  --     Basic Metabolic Panel: Recent Labs  Lab 01/25/21 1256 01/25/21 2209  NA 138 137  K 4.0 3.4*  CL 101  --   CO2 29  --   GLUCOSE 136*  --   BUN 13  --   CREATININE 0.76  --   CALCIUM 9.1  --     GFR: CrCl cannot be calculated (Unknown ideal weight.).  Liver Function Tests: No results for input(s): AST, ALT, ALKPHOS, BILITOT, PROT, ALBUMIN in the last 168 hours.  CBG: Recent Labs  Lab 01/26/21 0806 01/26/21 1217  GLUCAP 178* 173*     Recent Results (from the past 240 hour(s))  Resp Panel by RT-PCR (Flu A&B, Covid) Nasopharyngeal Swab     Status: None   Collection Time: 01/25/21  4:22 PM   Specimen: Nasopharyngeal Swab; Nasopharyngeal(NP) swabs in vial transport medium  Result Value Ref Range Status   SARS Coronavirus 2 by RT PCR NEGATIVE NEGATIVE Final    Comment: (NOTE) SARS-CoV-2 target nucleic acids are NOT DETECTED.  The SARS-CoV-2 RNA is generally detectable in upper respiratory specimens during the acute phase of infection. The lowest concentration of SARS-CoV-2 viral copies this assay can detect is 138 copies/mL. A negative result does not preclude SARS-Cov-2 infection and should not be used as the sole basis for  treatment or other patient management decisions. A negative result may occur with  improper specimen collection/handling, submission of specimen other than nasopharyngeal swab, presence of viral mutation(s) within the areas targeted by this assay, and inadequate number of viral copies(<138 copies/mL). A negative result must be combined with clinical observations, patient history, and epidemiological information. The expected result is Negative.  Fact Sheet for Patients:  BloggerCourse.com  Fact Sheet for Healthcare Providers:  SeriousBroker.it  This test is no t yet approved or cleared by the [place] FDA and  has been authorized for detection and/or diagnosis of SARS-CoV-2 by FDA under an Emergency Use Authorization (EUA). This EUA will remain  in effect (meaning this test can be used) for the duration of the COVID-19  declaration under Section 564(b)(1) of the Act, 21 U.S.C.section 360bbb-3(b)(1), unless the authorization is terminated  or revoked sooner.       Influenza A by PCR NEGATIVE NEGATIVE Final   Influenza B by PCR NEGATIVE NEGATIVE Final    Comment: (NOTE) The Xpert Xpress SARS-CoV-2/FLU/RSV plus assay is intended as an aid in the diagnosis of influenza from Nasopharyngeal swab specimens and should not be used as a sole basis for treatment. Nasal washings and aspirates are unacceptable for Xpert Xpress SARS-CoV-2/FLU/RSV testing.  Fact Sheet for Patients: BloggerCourse.com  Fact Sheet for Healthcare Providers: SeriousBroker.it  This test is not yet approved or cleared by the [place] FDA and has been authorized for detection and/or diagnosis of SARS-CoV-2 by FDA under an Emergency Use Authorization (EUA). This EUA will remain in effect (meaning this test can be used) for the duration of the COVID-19 declaration under Section 564(b)(1) of the Act, 21  U.S.C. section 360bbb-3(b)(1), unless the authorization is terminated or revoked.  Performed at [facility], [address] [place], [place] [address]          Radiology Studies: DG Chest 2 View  Result Date: 01/25/2021 CLINICAL DATA:  Chest pain.  Shortness of breath. EXAM: CHEST - 2 VIEW COMPARISON:  Chest radiographs 08/27/2009.  Cardiac CTA 06/11/2020. FINDINGS: The cardiomediastinal silhouette is unchanged with normal heart size. Aortic atherosclerosis is noted. The lungs remain hyperinflated with underlying emphysema. No airspace consolidation, edema, pleural effusion, pneumothorax is identified. No acute osseous abnormality is seen. IMPRESSION: COPD without evidence of acute cardiopulmonary process. Electronically Signed   By: Sebastian Ache M.D.   On: 01/25/2021 13:35        Scheduled Meds:  aspirin EC  81 mg Oral Daily   doxycycline  100 mg Oral Q12H   enoxaparin (LOVENOX) injection  40 mg Subcutaneous Daily   insulin aspart  0-15 Units Subcutaneous TID WC   ipratropium-albuterol  3 mL Nebulization Q6H   melatonin  3 mg Oral QHS   metFORMIN  500 mg Oral BID WC   methylPREDNISolone (SOLU-MEDROL) injection  40 mg Intravenous Q12H   Followed by   Melene Muller ON 01/27/2021] predniSONE  40 mg Oral Q breakfast   mirtazapine  15 mg Oral QHS   Continuous Infusions:   LOS: 0 days       Huey Bienenstock, MD [facility]   To contact the attending provider between 7A-7P or the covering provider during after hours 7P-7A, please log into the web site [email or web address] and access using universal [place] password for that web site. If you do not have the password, please call the hospital operator.  01/26/2021, 1:43 PM

## 2021-01-27 DIAGNOSIS — D72829 Elevated white blood cell count, unspecified: Secondary | ICD-10-CM

## 2021-01-27 DIAGNOSIS — J9601 Acute respiratory failure with hypoxia: Secondary | ICD-10-CM

## 2021-01-27 LAB — BASIC METABOLIC PANEL
Anion gap: 9 (ref 5–15)
BUN: 17 mg/dL (ref 8–23)
CO2: 26 mmol/L (ref 22–32)
Calcium: 8.9 mg/dL (ref 8.9–10.3)
Chloride: 102 mmol/L (ref 98–111)
Creatinine, Ser: 0.66 mg/dL (ref 0.61–1.24)
GFR, Estimated: >60 mL/min (ref >60)
Glucose, Bld: 149 mg/dL — ABNORMAL HIGH (ref 70–99)
Potassium: 4.3 mmol/L (ref 3.5–5.1)
Sodium: 137 mmol/L (ref 135–145)

## 2021-01-27 LAB — GLUCOSE, CAPILLARY
Glucose-Capillary: 127 mg/dL — ABNORMAL HIGH (ref 70–99)
Glucose-Capillary: 138 mg/dL — ABNORMAL HIGH (ref 70–99)
Glucose-Capillary: 132 mg/dL — ABNORMAL HIGH (ref 70–99)
Glucose-Capillary: 141 mg/dL — ABNORMAL HIGH (ref 70–99)

## 2021-01-27 LAB — CBC
HCT: 47 % (ref 39.0–52.0)
Hemoglobin: 15.5 g/dL (ref 13.0–17.0)
MCH: 32.2 pg (ref 26.0–34.0)
MCHC: 33 g/dL (ref 30.0–36.0)
MCV: 97.7 fL (ref 80.0–100.0)
Platelets: 170 10*3/uL (ref 150–400)
RBC: 4.81 MIL/uL (ref 4.22–5.81)
RDW: 11.9 % (ref 11.5–15.5)
WBC: 17.2 10*3/uL — ABNORMAL HIGH (ref 4.0–10.5)
nRBC: 0 % (ref 0.0–0.2)

## 2021-01-27 LAB — PROCALCITONIN: Procalcitonin: <0.1 ng/mL

## 2021-01-27 MED ORDER — PNEUMOCOCCAL VAC POLYVALENT 25 MCG/0.5ML IJ INJ
0.5000 mL | INJECTION | INTRAMUSCULAR | Status: AC
  Administered 2021-01-28: 0.5 mL via INTRAMUSCULAR
  Filled 2021-01-27: qty 0.5

## 2021-01-27 MED ORDER — MOMETASONE FURO-FORMOTEROL FUM 200-5 MCG/ACT IN AERO
2.0000 | INHALATION_SPRAY | Freq: Two times a day (BID) | RESPIRATORY_TRACT | Status: DC
  Administered 2021-01-28: 2 via RESPIRATORY_TRACT
  Filled 2021-01-27: qty 8.8

## 2021-01-27 MED ORDER — PANTOPRAZOLE SODIUM 40 MG PO TBEC
40.0000 mg | DELAYED_RELEASE_TABLET | Freq: Every day | ORAL | Status: DC
  Administered 2021-01-27 – 2021-01-28 (×2): 40 mg via ORAL
  Filled 2021-01-27 (×2): qty 1

## 2021-01-27 MED ORDER — PREDNISONE 20 MG PO TABS: 40.0000 mg | ORAL_TABLET | Freq: Every day | ORAL | Status: DC

## 2021-01-27 MED ORDER — METHYLPREDNISOLONE SODIUM SUCC 125 MG IJ SOLR
125.0000 mg | Freq: Three times a day (TID) | INTRAMUSCULAR | Status: DC
  Administered 2021-01-27 – 2021-01-28 (×3): 125 mg via INTRAVENOUS
  Filled 2021-01-27 (×3): qty 2

## 2021-01-27 NOTE — Progress Notes (Addendum)
PROGRESS NOTE    George Collins  AQT:[id number] DOB: [birth year] DOA: 01/25/2021 PCP: Shon Hale, MD   Chief Complaint  Patient presents with   Shortness of Breath    Brief Narrative:    George Collins is a 75 y.o. male with medical history significant of hyperlipidemia, rectal adenoma, type 2 diabetes, tobacco use who is [facility] where he presented with progressively worse dyspnea, wheezing and whitish productive sputum cough for the last few days after initially having headache, rhinorrhea and sore throat.  He denied any travel history or sick contact exposure.  Denied hemoptysis but have pleuritic chest pain.  No typical chest pain, palpitations, diaphoresis, PND, orthopnea or recent pitting edema of the lower extremities.  Denied abdominal pain, nausea, vomiting, diarrhea, constipation, melena or hematochezia.  No dysuria, frequency or hematuria.  No polyuria, polydipsia, polyphagia or blurred vision. -He was found to have new oxygen requirement, and he is admitted for CV exacerbation treatment    Assessment & Plan:   Principal Problem:   COPD exacerbation (HCC) Active Problems:   Type 2 diabetes mellitus (HCC)   Leukocytosis   Hyperlipidemia   Acute respiratory failure (HCC)   Tobacco abuse  Acute respiratory failure with hypoxia (HCC) -Due to COPD exacerbation, saturating 80% on room air with activity, [place] will need home oxygen on discharge, he was encouraged use incentive spirometer and flutter valve.   COPD exacerbation (HCC) Continue supplemental oxygen. Continue bronchodilators. Continue with IV Solu-Medrol, I will increase to 40 mg every 8 hours.  And prednisone taper afterword Continue withwith doxycycline.    Type 2 diabetes mellitus (HCC) Carbohydrate modified diet. Continue metformin 500 mg p.o. twice daily. CBG monitoring with RI SS.     Leukocytosis -He is nontoxic-appearing, but will check  procalcitonin.    Hyperlipidemia Not on statin.  We will check lipid panel.     Tobacco abuse Committed to quit. Declined nicotine replacement therapy   DVT prophylaxis: Lovenox Code Status: Full Family Communication: None at bedside Disposition:   Status is: Inpatient  Remains inpatient appropriate because:IV treatments appropriate due to intensity of illness or inability to take PO  Dispo: The patient is from: Home              Anticipated d/c is to: Home              Patient currently is not medically stable to d/c.   Difficult to place patient No       Consultants:  None  Subjective:  Denies any chest pain, reports cough, nonproductive, report shortness of breath is improving, but he was 80% on room air with activity yesterday.  Objective: Vitals:   01/27/21 0100 01/27/21 0347 01/27/21 0813 01/27/21 1219  BP:  (!) 127/55 109/65 120/72  Pulse: 71  71 79  Resp:  20 14 17   Temp:  98.2 F (36.8 C) 97.6 F (36.4 C) 97.7 F (36.5 C)  TempSrc:  Oral Oral Oral  SpO2: 99% 97% 95% 95%    Intake/Output Summary (Last 24 hours) at 01/27/2021 1327 Last data filed at 01/27/2021 1221 Gross per 24 hour  Intake 700 ml  Output 1150 ml  Net -450 ml   There were no vitals filed for this visit.  Examination:  Awake Alert, Oriented X 3, No new F.N deficits, Normal affect Symmetrical Chest wall movement, he is with diminished air entry bilaterally and scattered wheezing. RRR,No Gallops,Rubs or new Murmurs, No Parasternal Heave +  ve B.Sounds, Abd Soft, No tenderness, No rebound - guarding or rigidity. No Cyanosis, Clubbing or edema, No new Rash or bruise       Data Reviewed: I have personally reviewed following labs and imaging studies  CBC: Recent Labs  Lab 01/25/21 1256 01/25/21 2209 01/27/21 0015  WBC 14.2*  --  17.2*  HGB 15.8 16.7 15.5  HCT 47.6 49.0 47.0  MCV 96.9  --  97.7  PLT 175  --  170    Basic Metabolic Panel: Recent Labs  Lab 01/25/21 1256  01/25/21 2209 01/27/21 0015  NA 138 137 137  K 4.0 3.4* 4.3  CL 101  --  102  CO2 29  --  26  GLUCOSE 136*  --  149*  BUN 13  --  17  CREATININE 0.76  --  0.66  CALCIUM 9.1  --  8.9    GFR: CrCl cannot be calculated (Unknown ideal weight.).  Liver Function Tests: No results for input(s): AST, ALT, ALKPHOS, BILITOT, PROT, ALBUMIN in the last 168 hours.  CBG: Recent Labs  Lab 01/26/21 1217 01/26/21 1708 01/26/21 2040 01/27/21 0815 01/27/21 1218  GLUCAP 173* 112* 130* 127* 138*     Recent Results (from the past 240 hour(s))  Resp Panel by RT-PCR (Flu A&B, Covid) Nasopharyngeal Swab     Status: None   Collection Time: 01/25/21  4:22 PM   Specimen: Nasopharyngeal Swab; Nasopharyngeal(NP) swabs in vial transport medium  Result Value Ref Range Status   SARS Coronavirus 2 by RT PCR NEGATIVE NEGATIVE Final    Comment: (NOTE) SARS-CoV-2 target nucleic acids are NOT DETECTED.  The SARS-CoV-2 RNA is generally detectable in upper respiratory specimens during the acute phase of infection. The lowest concentration of SARS-CoV-2 viral copies this assay can detect is 138 copies/mL. A negative result does not preclude SARS-Cov-2 infection and should not be used as the sole basis for treatment or other patient management decisions. A negative result may occur with  improper specimen collection/handling, submission of specimen other than nasopharyngeal swab, presence of viral mutation(s) within the areas targeted by this assay, and inadequate number of viral copies(<138 copies/mL). A negative result must be combined with clinical observations, patient history, and epidemiological information. The expected result is Negative.  Fact Sheet for Patients:  BloggerCourse.com  Fact Sheet for Healthcare Providers:  SeriousBroker.it  This test is no t yet approved or cleared by the [place] FDA and  has been authorized for detection  and/or diagnosis of SARS-CoV-2 by FDA under an Emergency Use Authorization (EUA). This EUA will remain  in effect (meaning this test can be used) for the duration of the COVID-19 declaration under Section 564(b)(1) of the Act, 21 U.S.C.section 360bbb-3(b)(1), unless the authorization is terminated  or revoked sooner.       Influenza A by PCR NEGATIVE NEGATIVE Final   Influenza B by PCR NEGATIVE NEGATIVE Final    Comment: (NOTE) The Xpert Xpress SARS-CoV-2/FLU/RSV plus assay is intended as an aid in the diagnosis of influenza from Nasopharyngeal swab specimens and should not be used as a sole basis for treatment. Nasal washings and aspirates are unacceptable for Xpert Xpress SARS-CoV-2/FLU/RSV testing.  Fact Sheet for Patients: BloggerCourse.com  Fact Sheet for Healthcare Providers: SeriousBroker.it  This test is not yet approved or cleared by the [place] FDA and has been authorized for detection and/or diagnosis of SARS-CoV-2 by FDA under an Emergency Use Authorization (EUA). This EUA will remain in effect (meaning this test can  be used) for the duration of the COVID-19 declaration under Section 564(b)(1) of the Act, 21 U.S.C. section 360bbb-3(b)(1), unless the authorization is terminated or revoked.  Performed at [facility], [address], [place], [place] [address]          Radiology Studies: No results found.      Scheduled Meds:  aspirin EC  81 mg Oral Daily   doxycycline  100 mg Oral Q12H   enoxaparin (LOVENOX) injection  40 mg Subcutaneous Daily   insulin aspart  0-15 Units Subcutaneous TID WC   ipratropium-albuterol  3 mL Nebulization Q6H   melatonin  3 mg Oral QHS   metFORMIN  500 mg Oral BID WC   methylPREDNISolone (SOLU-MEDROL) injection  125 mg Intravenous Q8H   Followed by   Melene Muller ON 01/30/2021] predniSONE  40 mg Oral Q breakfast   mirtazapine  15 mg Oral QHS    pantoprazole  40 mg Oral Daily   pneumococcal 23 valent vaccine  0.5 mL Intramuscular Tomorrow-1000   Continuous Infusions:   LOS: 1 day       Huey Bienenstock, MD [facility]   To contact the attending provider between 7A-7P or the covering provider during after hours 7P-7A, please log into the web site [email or web address] and access using universal [place] password for that web site. If you do not have the password, please call the hospital operator.  01/27/2021, 1:27 PM

## 2021-01-28 ENCOUNTER — Other Ambulatory Visit (HOSPITAL_COMMUNITY): Payer: Self-pay

## 2021-01-28 LAB — BASIC METABOLIC PANEL
Anion gap: 9 (ref 5–15)
BUN: 21 mg/dL (ref 8–23)
CO2: 26 mmol/L (ref 22–32)
Calcium: 9 mg/dL (ref 8.9–10.3)
Chloride: 99 mmol/L (ref 98–111)
Creatinine, Ser: 0.83 mg/dL (ref 0.61–1.24)
GFR, Estimated: >60 mL/min (ref >60)
Glucose, Bld: 221 mg/dL — ABNORMAL HIGH (ref 70–99)
Potassium: 4.6 mmol/L (ref 3.5–5.1)
Sodium: 134 mmol/L — ABNORMAL LOW (ref 135–145)

## 2021-01-28 LAB — LIPID PANEL
Cholesterol: 150 mg/dL (ref 0–200)
HDL: 49 mg/dL (ref >40)
LDL Cholesterol: 85 mg/dL (ref 0–99)
Total CHOL/HDL Ratio: 3.1 RATIO
Triglycerides: 78 mg/dL (ref <150)
VLDL: 16 mg/dL (ref 0–40)

## 2021-01-28 LAB — CBC
HCT: 50.1 % (ref 39.0–52.0)
Hemoglobin: 16.5 g/dL (ref 13.0–17.0)
MCH: 32.2 pg (ref 26.0–34.0)
MCHC: 32.9 g/dL (ref 30.0–36.0)
MCV: 97.7 fL (ref 80.0–100.0)
Platelets: 177 10*3/uL (ref 150–400)
RBC: 5.13 MIL/uL (ref 4.22–5.81)
RDW: 11.9 % (ref 11.5–15.5)
WBC: 14.2 10*3/uL — ABNORMAL HIGH (ref 4.0–10.5)
nRBC: 0 % (ref 0.0–0.2)

## 2021-01-28 LAB — GLUCOSE, CAPILLARY
Glucose-Capillary: 176 mg/dL — ABNORMAL HIGH (ref 70–99)
Glucose-Capillary: 139 mg/dL — ABNORMAL HIGH (ref 70–99)

## 2021-01-28 MED ORDER — ASPIRIN 81 MG PO TBEC
81.0000 mg | DELAYED_RELEASE_TABLET | Freq: Every day | ORAL | 0 refills | Status: AC
  Filled 2021-01-28: qty 30, 30d supply, fill #0

## 2021-01-28 MED ORDER — PREDNISONE 10 MG (21) PO TBPK
ORAL_TABLET | ORAL | 0 refills | Status: DC
  Filled 2021-01-28: qty 21, 6d supply, fill #0

## 2021-01-28 MED ORDER — PREDNISONE 10 MG (21) PO TBPK: ORAL_TABLET | ORAL | 0 refills | Status: DC

## 2021-01-28 NOTE — Discharge Summary (Signed)
Physician Discharge Summary  George Collins DXA:[id number] DOB: 1945-11-27 DOA: 01/25/2021  PCP: Shon Hale, MD  Admit date: 01/25/2021 Discharge date: 01/28/2021  Admitted From: Home Disposition:  Home  Recommendations for Outpatient Follow-up:  Follow up with PCP in 1-2 weeks   Home Health:NO Equipment/Devices:oxygen   Discharge Condition:Stable CODE STATUS:FULL Diet recommendation: Heart Healthy   Brief/Interim Summary:   Discharge Diagnoses:  Principal Problem:   COPD exacerbation (HCC) Active Problems:   Type 2 diabetes mellitus (HCC)   Leukocytosis   Hyperlipidemia   Acute respiratory failure (HCC)   Tobacco abuse  Acute respiratory failure with hypoxia (HCC) -Due to COPD exacerbation, he is with oxygen requirement initially, this has significantly improved.   COPD exacerbation -Treated with IV steroids, with doxycycline given cough with effective phlegm, this has significantly improved, he will be discharged on prednisone taper, no indication for antibiotics, patient has nebulizers at home already..     Type 2 diabetes mellitus (HCC) Resume home medications on discharge     Leukocytosis -He is nontoxic-appearing, procalcitonin within normal limit, no indication of infection.     Hyperlipidemia Not on statin.     Tobacco abuse Committed to quit.   Discharge Instructions  Discharge Instructions     Diet - low sodium heart healthy   Complete by: As directed    Discharge instructions   Complete by: As directed    Follow with Primary MD Shon Hale, MD in 7 days   Get CBC, CMP,  checked  by Primary MD next visit.    Activity: As tolerated with Full fall precautions use walker/cane & assistance as needed   Disposition Home    Diet: Heart Healthy    On your next visit with your primary care physician please Get Medicines reviewed and adjusted.   Please request your Prim.MD to go over all Hospital Tests and  Procedure/Radiological results at the follow up, please get all Hospital records sent to your Prim MD by signing hospital release before you go home.   If you experience worsening of your admission symptoms, develop shortness of breath, life threatening emergency, suicidal or homicidal thoughts you must seek medical attention immediately by calling 911 or calling your MD immediately  if symptoms less severe.  You Must read complete instructions/literature along with all the possible adverse reactions/side effects for all the Medicines you take and that have been prescribed to you. Take any new Medicines after you have completely understood and accpet all the possible adverse reactions/side effects.   Do not drive, operating heavy machinery, perform activities at heights, swimming or participation in water activities or provide baby sitting services if your were admitted for syncope or siezures until you have seen by Primary MD or a Neurologist and advised to do so again.  Do not drive when taking Pain medications.    Do not take more than prescribed Pain, Sleep and Anxiety Medications  Special Instructions: If you have smoked or chewed Tobacco  in the last 2 yrs please stop smoking, stop any regular Alcohol  and or any Recreational drug use.  Wear Seat belts while driving.   Please note  You were cared for by a hospitalist during your hospital stay. If you have any questions about your discharge medications or the care you received while you were in the hospital after you are discharged, you can call the unit and asked to speak with the hospitalist on call if the hospitalist that took care of you is  not available. Once you are discharged, your primary care physician will handle any further medical issues. Please note that NO REFILLS for any discharge medications will be authorized once you are discharged, as it is imperative that you return to your primary care physician (or establish a  relationship with a primary care physician if you do not have one) for your aftercare needs so that they can reassess your need for medications and monitor your lab values.   Increase activity slowly   Complete by: As directed       Allergies as of 01/28/2021   No Known Allergies      Medication List     TAKE these medications    acetaminophen 500 MG tablet Commonly known as: TYLENOL Take 500 mg by mouth every 6 (six) hours as needed for moderate pain or headache.   albuterol 108 (90 Base) MCG/ACT inhaler Commonly known as: VENTOLIN HFA Inhale 2 puffs into the lungs every 6 (six) hours as needed for wheezing or shortness of breath.   Aspirin Low Dose 81 MG EC tablet Generic drug: aspirin Take 1 tablet (81 mg total) by mouth daily. What changed: additional instructions   atorvastatin 10 MG tablet Commonly known as: LIPITOR Take 10 mg by mouth daily.   budesonide-formoterol 160-4.5 MCG/ACT inhaler Commonly known as: SYMBICORT Inhale 2 puffs into the lungs 2 (two) times daily.   Cholecalciferol 50 MCG (2000 UT) Tabs Take 2,000 Units by mouth daily.   fluticasone-salmeterol 250-50 MCG/ACT Aepb Commonly known as: ADVAIR Inhale 1 puff into the lungs in the morning and at bedtime.   glipiZIDE 5 MG tablet Commonly known as: GLUCOTROL Take 2.5 mg by mouth 2 (two) times daily before a meal.   metFORMIN 1000 MG tablet Commonly known as: GLUCOPHAGE Take 0.5 tablets (500 mg total) by mouth 2 (two) times daily.   mirtazapine 15 MG tablet Commonly known as: REMERON Take 1 tablet by mouth at bedtime.   nicotine polacrilex 2 MG lozenge Commonly known as: COMMIT Take 2 mg by mouth daily as needed for smoking cessation.   predniSONE 10 MG (21) Tbpk tablet Commonly known as: STERAPRED UNI-PAK 21 TAB Use per  package instruction   traZODone 50 MG tablet Commonly known as: DESYREL Take 25 mg by mouth at bedtime.               Durable Medical Equipment  (From  admission, onward)           Start     Ordered   01/28/21 1125  DME Oxygen  Once       Question Answer Comment  Length of Need 6 Months   Mode or (Route) Nasal cannula   Liters per Minute 2   Frequency Continuous (stationary and portable oxygen unit needed)   Oxygen conserving device Yes   Oxygen delivery system Gas      01/28/21 1125            Follow-up Information     Shon Hale, MD Follow up.   Specialty: Family Medicine Contact information: [address] [place] [address] [phone]                No Known Allergies  Consultations: None   Procedures/Studies: DG Chest 2 View  Result Date: 01/25/2021 CLINICAL DATA:  Chest pain.  Shortness of breath. EXAM: CHEST - 2 VIEW COMPARISON:  Chest radiographs 08/27/2009.  Cardiac CTA 06/11/2020. FINDINGS: The cardiomediastinal silhouette is unchanged with normal heart size.  Aortic atherosclerosis is noted. The lungs remain hyperinflated with underlying emphysema. No airspace consolidation, edema, pleural effusion, pneumothorax is identified. No acute osseous abnormality is seen. IMPRESSION: COPD without evidence of acute cardiopulmonary process. Electronically Signed   By: Sebastian Ache M.D.   On: 01/25/2021 13:35      Subjective:  Patient reports is feeling better, dyspnea has improved, Still present but significantly improved, no phlegm. Discharge Exam: Vitals:   01/28/21 0921 01/28/21 1220  BP:  (!) 152/91  Pulse:  86  Resp:  17  Temp:  98.3 F (36.8 C)  SpO2: 95% 90%   Vitals:   01/28/21 0515 01/28/21 0808 01/28/21 0921 01/28/21 1220  BP: (!) 146/92 128/65  (!) 152/91  Pulse:  66  86  Resp:  13  17  Temp: 98.5 F (36.9 C) 98.2 F (36.8 C)  98.3 F (36.8 C)  TempSrc:  Oral  Oral  SpO2: 93% 92% 95% 90%  Weight:        General: Pt is alert, awake, not in acute distress Cardiovascular: RRR, S1/S2 +, no rubs, no gallops Respiratory: CTA bilaterally, no wheezing, no  rhonchi Abdominal: Soft, NT, ND, bowel sounds + Extremities: no edema, no cyanosis    The results of significant diagnostics from this hospitalization (including imaging, microbiology, ancillary and laboratory) are listed below for reference.     Microbiology: Recent Results (from the past 240 hour(s))  Resp Panel by RT-PCR (Flu A&B, Covid) Nasopharyngeal Swab     Status: None   Collection Time: 01/25/21  4:22 PM   Specimen: Nasopharyngeal Swab; Nasopharyngeal(NP) swabs in vial transport medium  Result Value Ref Range Status   SARS Coronavirus 2 by RT PCR NEGATIVE NEGATIVE Final    Comment: (NOTE) SARS-CoV-2 target nucleic acids are NOT DETECTED.  The SARS-CoV-2 RNA is generally detectable in upper respiratory specimens during the acute phase of infection. The lowest concentration of SARS-CoV-2 viral copies this assay can detect is 138 copies/mL. A negative result does not preclude SARS-Cov-2 infection and should not be used as the sole basis for treatment or other patient management decisions. A negative result may occur with  improper specimen collection/handling, submission of specimen other than nasopharyngeal swab, presence of viral mutation(s) within the areas targeted by this assay, and inadequate number of viral copies(<138 copies/mL). A negative result must be combined with clinical observations, patient history, and epidemiological information. The expected result is Negative.  Fact Sheet for Patients:  BloggerCourse.com  Fact Sheet for Healthcare Providers:  SeriousBroker.it  This test is no t yet approved or cleared by the [place] FDA and  has been authorized for detection and/or diagnosis of SARS-CoV-2 by FDA under an Emergency Use Authorization (EUA). This EUA will remain  in effect (meaning this test can be used) for the duration of the COVID-19 declaration under Section 564(b)(1) of the Act,  21 U.S.C.section 360bbb-3(b)(1), unless the authorization is terminated  or revoked sooner.       Influenza A by PCR NEGATIVE NEGATIVE Final   Influenza B by PCR NEGATIVE NEGATIVE Final    Comment: (NOTE) The Xpert Xpress SARS-CoV-2/FLU/RSV plus assay is intended as an aid in the diagnosis of influenza from Nasopharyngeal swab specimens and should not be used as a sole basis for treatment. Nasal washings and aspirates are unacceptable for Xpert Xpress SARS-CoV-2/FLU/RSV testing.  Fact Sheet for Patients: BloggerCourse.com  Fact Sheet for Healthcare Providers: SeriousBroker.it  This test is not yet approved or cleared by the [place] FDA  and has been authorized for detection and/or diagnosis of SARS-CoV-2 by FDA under an Emergency Use Authorization (EUA). This EUA will remain in effect (meaning this test can be used) for the duration of the COVID-19 declaration under Section 564(b)(1) of the Act, 21 U.S.C. section 360bbb-3(b)(1), unless the authorization is terminated or revoked.  Performed at [facility], [address], [place], [place] [id number]      Labs: BNP (last 3 results) No results for input(s): BNP in the last 8760 hours. Basic Metabolic Panel: Recent Labs  Lab 01/25/21 1256 01/25/21 2209 01/27/21 0015 01/28/21 0105  NA 138 137 137 134*  K 4.0 3.4* 4.3 4.6  CL 101  --  102 99  CO2 29  --  26 26  GLUCOSE 136*  --  149* 221*  BUN 13  --  17 21  CREATININE 0.76  --  0.66 0.83  CALCIUM 9.1  --  8.9 9.0   Liver Function Tests: No results for input(s): AST, ALT, ALKPHOS, BILITOT, PROT, ALBUMIN in the last 168 hours. No results for input(s): LIPASE, AMYLASE in the last 168 hours. No results for input(s): AMMONIA in the last 168 hours. CBC: Recent Labs  Lab 01/25/21 1256 01/25/21 2209 01/27/21 0015 01/28/21 0105  WBC 14.2*  --  17.2* 14.2*  HGB 15.8 16.7 15.5 16.5  HCT 47.6  49.0 47.0 50.1  MCV 96.9  --  97.7 97.7  PLT 175  --  170 177   Cardiac Enzymes: No results for input(s): CKTOTAL, CKMB, CKMBINDEX, TROPONINI in the last 168 hours. BNP: Invalid input(s): POCBNP CBG: Recent Labs  Lab 01/27/21 1218 01/27/21 1610 01/27/21 2118 01/28/21 0812 01/28/21 1223  GLUCAP 138* 132* 141* 176* 139*   D-Dimer No results for input(s): DDIMER in the last 72 hours. Hgb A1c Recent Labs    01/26/21 0353  HGBA1C 5.2   Lipid Profile Recent Labs    01/28/21 0105  CHOL 150  HDL 49  LDLCALC 85  TRIG 78  CHOLHDL 3.1   Thyroid function studies No results for input(s): TSH, T4TOTAL, T3FREE, THYROIDAB in the last 72 hours.  Invalid input(s): FREET3 Anemia work up No results for input(s): VITAMINB12, FOLATE, FERRITIN, TIBC, IRON, RETICCTPCT in the last 72 hours. Urinalysis No results found for: COLORURINE, APPEARANCEUR, LABSPEC, PHURINE, GLUCOSEU, HGBUR, BILIRUBINUR, KETONESUR, PROTEINUR, UROBILINOGEN, NITRITE, LEUKOCYTESUR Sepsis Labs Invalid input(s): PROCALCITONIN,  WBC,  LACTICIDVEN Microbiology Recent Results (from the past 240 hour(s))  Resp Panel by RT-PCR (Flu A&B, Covid) Nasopharyngeal Swab     Status: None   Collection Time: 01/25/21  4:22 PM   Specimen: Nasopharyngeal Swab; Nasopharyngeal(NP) swabs in vial transport medium  Result Value Ref Range Status   SARS Coronavirus 2 by RT PCR NEGATIVE NEGATIVE Final    Comment: (NOTE) SARS-CoV-2 target nucleic acids are NOT DETECTED.  The SARS-CoV-2 RNA is generally detectable in upper respiratory specimens during the acute phase of infection. The lowest concentration of SARS-CoV-2 viral copies this assay can detect is 138 copies/mL. A negative result does not preclude SARS-Cov-2 infection and should not be used as the sole basis for treatment or other patient management decisions. A negative result may occur with  improper specimen collection/handling, submission of specimen other than  nasopharyngeal swab, presence of viral mutation(s) within the areas targeted by this assay, and inadequate number of viral copies(<138 copies/mL). A negative result must be combined with clinical observations, patient history, and epidemiological information. The expected result is Negative.  Fact Sheet for Patients:  BloggerCourse.com  Fact Sheet for Healthcare Providers:  SeriousBroker.it  This test is no t yet approved or cleared by the [place] FDA and  has been authorized for detection and/or diagnosis of SARS-CoV-2 by FDA under an Emergency Use Authorization (EUA). This EUA will remain  in effect (meaning this test can be used) for the duration of the COVID-19 declaration under Section 564(b)(1) of the Act, 21 U.S.C.section 360bbb-3(b)(1), unless the authorization is terminated  or revoked sooner.       Influenza A by PCR NEGATIVE NEGATIVE Final   Influenza B by PCR NEGATIVE NEGATIVE Final    Comment: (NOTE) The Xpert Xpress SARS-CoV-2/FLU/RSV plus assay is intended as an aid in the diagnosis of influenza from Nasopharyngeal swab specimens and should not be used as a sole basis for treatment. Nasal washings and aspirates are unacceptable for Xpert Xpress SARS-CoV-2/FLU/RSV testing.  Fact Sheet for Patients: BloggerCourse.com  Fact Sheet for Healthcare Providers: SeriousBroker.it  This test is not yet approved or cleared by the [place] FDA and has been authorized for detection and/or diagnosis of SARS-CoV-2 by FDA under an Emergency Use Authorization (EUA). This EUA will remain in effect (meaning this test can be used) for the duration of the COVID-19 declaration under Section 564(b)(1) of the Act, 21 U.S.C. section 360bbb-3(b)(1), unless the authorization is terminated or revoked.  Performed at [facility], [address], [place], [place] [address]      Time coordinating discharge:  30 minutes  SIGNED:   Huey Bienenstock, MD  [facility] 01/28/2021, 1:53 PM Pager   If 7PM-7AM, please contact night-coverage [email or web address] Password [email or web address]

## 2021-01-28 NOTE — TOC Progression Note (Signed)
Transition of Care [facility]) - Progression Note    Patient Details  Name: MAIKEL NEISLER MRN: [id number] Date of Birth: Aug 14, 1945  Transition of [facility]) CM/SW Contact  Huston Foley Jacklynn Ganong, RN Phone Number: 01/28/2021, 11:51 AM  Clinical Narrative:  Patient is 75 yr old gentleman admitted with COPD exacerbation. Patient was not using oxygen prior to admission but now has need for oxygen. CM spoke with patient, he states he has stair lift that takes him upstairs, has support at discharge. Request for oxygen called to [employer].     Expected Discharge Plan: Home/Self Care Barriers to Discharge: No Barriers Identified  Expected Discharge Plan and Services Expected Discharge Plan: Home/Self Care   Discharge Planning Services: CM Consult Post Acute Care Choice: Durable Medical Equipment   Expected Discharge Date: 01/28/21               DME Arranged: Oxygen DME Agency: [employer] Date DME Agency Contacted: 01/28/21 Time DME Agency Contacted: 1150 Representative spoke with at DME Agency: Velna Hatchet             Social Determinants of Health (SDOH) Interventions    Readmission Risk Interventions No flowsheet data found.

## 2021-01-28 NOTE — Progress Notes (Signed)
Discharge education and packet provided to patient at bedside, all questions and concerns addressed. 02 delivered to bedside. Pt brother at main entrance to transport pt home.

## 2021-01-28 NOTE — Progress Notes (Signed)
SATURATION QUALIFICATIONS: (This note is used to comply with regulatory documentation for home oxygen)  Patient Saturations on Room Air at Rest = 92%  Patient Saturations on Room Air while Ambulating = 88%  Patient Saturations on 2 Liters of oxygen while Ambulating = 94%  Please briefly explain why patient needs home oxygen: During ambulation patient c/o difficultly catching his breath, and feeling very tired.

## 2021-01-28 NOTE — Discharge Instructions (Signed)
Follow with Primary MD Shon Hale, MD in 7 days   Get CBC, CMP,  checked  by Primary MD next visit.    Activity: As tolerated with Full fall precautions use walker/cane & assistance as needed   Disposition Home    Diet: Heart Healthy    On your next visit with your primary care physician please Get Medicines reviewed and adjusted.   Please request your Prim.MD to go over all Hospital Tests and Procedure/Radiological results at the follow up, please get all Hospital records sent to your Prim MD by signing hospital release before you go home.   If you experience worsening of your admission symptoms, develop shortness of breath, life threatening emergency, suicidal or homicidal thoughts you must seek medical attention immediately by calling 911 or calling your MD immediately  if symptoms less severe.  You Must read complete instructions/literature along with all the possible adverse reactions/side effects for all the Medicines you take and that have been prescribed to you. Take any new Medicines after you have completely understood and accpet all the possible adverse reactions/side effects.   Do not drive, operating heavy machinery, perform activities at heights, swimming or participation in water activities or provide baby sitting services if your were admitted for syncope or siezures until you have seen by Primary MD or a Neurologist and advised to do so again.  Do not drive when taking Pain medications.    Do not take more than prescribed Pain, Sleep and Anxiety Medications  Special Instructions: If you have smoked or chewed Tobacco  in the last 2 yrs please stop smoking, stop any regular Alcohol  and or any Recreational drug use.  Wear Seat belts while driving.   Please note  You were cared for by a hospitalist during your hospital stay. If you have any questions about your discharge medications or the care you received while you were in the hospital after you are  discharged, you can call the unit and asked to speak with the hospitalist on call if the hospitalist that took care of you is not available. Once you are discharged, your primary care physician will handle any further medical issues. Please note that NO REFILLS for any discharge medications will be authorized once you are discharged, as it is imperative that you return to your primary care physician (or establish a relationship with a primary care physician if you do not have one) for your aftercare needs so that they can reassess your need for medications and monitor your lab values.

## 2021-01-31 DIAGNOSIS — J9600 Acute respiratory failure, unspecified whether with hypoxia or hypercapnia: Secondary | ICD-10-CM | POA: Diagnosis not present

## 2021-01-31 DIAGNOSIS — J441 Chronic obstructive pulmonary disease with (acute) exacerbation: Secondary | ICD-10-CM | POA: Diagnosis not present

## 2021-02-05 DIAGNOSIS — Z720 Tobacco use: Secondary | ICD-10-CM | POA: Diagnosis not present

## 2021-02-05 DIAGNOSIS — Z79899 Other long term (current) drug therapy: Secondary | ICD-10-CM | POA: Diagnosis not present

## 2021-02-05 DIAGNOSIS — J449 Chronic obstructive pulmonary disease, unspecified: Secondary | ICD-10-CM | POA: Diagnosis not present

## 2021-02-25 NOTE — Progress Notes (Signed)
Synopsis: Referred for COPD by Shon Hale, *  Subjective:   PATIENT ID: George Collins GENDER: male DOB: [birth year], MRN: [id number]  Chief Complaint  Patient presents with   Consult    Patient has COPD and was admitted into hospital 01/25/21. Patient states he has shortness of breath with exertion. Wears 1 1/2 liters with exertion and at night. Productive cough    75yM with history of DM2, HTN, COPD, smoking quit 01/25/21 20-25 py  Recent admission through 01/28/21 for AECOPD treated with doxy and steroids. This is the only time he's ever received a course of prednisone. He does think it was helpful for dyspnea.  He has no significant cough typically - it has only worsened recently now that he's stopped smoking. He has DOE to 25 feet. Worsened over last year or 2  Otherwise pertinent review of systems is negative.  He has no family history of lung disease.  He worked at [employer], makes [employer]. Doesn't wear a mask at work. His breathing is a bit worse at work. No pets. Has never lived outside of [place]. 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 Some agent orange exposure when in [employer]. He goes to [place].   Past Medical History:  Diagnosis Date   Hyperlipidemia 01/26/2021   Personal history of rectal adenoma 08/09/2009   Type 2 diabetes mellitus (HCC) 01/26/2021     Family History  Problem Relation Age of Onset   Hypertension Other      Past Surgical History:  Procedure Laterality Date   BACK SURGERY     NECK SURGERY     RIGHT/LEFT HEART CATH AND CORONARY ANGIOGRAPHY N/A 06/19/2020   Procedure: RIGHT/LEFT HEART CATH AND CORONARY ANGIOGRAPHY;  Surgeon: Corky Crafts, MD;  Location: [facility] INVASIVE CV LAB;  Service: Cardiovascular;  Laterality: N/A;    Social History   Socioeconomic History   Marital status: Single    Spouse name: Not on file   Number of children: Not on file   Years of education: Not on file   Highest education  level: Not on file  Occupational History   Not on file  Tobacco Use   Smoking status: Former    Packs/day: 0.50    Years: 30.00    Pack years: 15.00    Types: Cigarettes    Quit date: 01/25/2021    Years since quitting: 0.0   Smokeless tobacco: Never  Vaping Use   Vaping Use: Never used  Substance and Sexual Activity   Alcohol use: Yes    Comment: occ   Drug use: No   Sexual activity: Never  Other Topics Concern   Not on file  Social History Narrative   Not on file   Social Determinants of Health   Financial Resource Strain: Not on file  Food Insecurity: Not on file  Transportation Needs: Not on file  Physical Activity: Not on file  Stress: Not on file  Social Connections: Not on file  Intimate Partner Violence: Not on file     No Known Allergies   Outpatient Medications Prior to Visit  Medication Sig Dispense Refill   acetaminophen (TYLENOL) 500 MG tablet Take 500 mg by mouth every 6 (six) hours as needed for moderate pain or headache.     albuterol (VENTOLIN HFA) 108 (90 Base) MCG/ACT inhaler Inhale 2 puffs into the lungs every 6 (six) hours as needed for wheezing or shortness of breath.  aspirin 81 MG EC tablet Take 1 tablet (81 mg total) by mouth daily. 30 tablet 0   atorvastatin (LIPITOR) 10 MG tablet Take 10 mg by mouth daily.     budesonide-formoterol (SYMBICORT) 160-4.5 MCG/ACT inhaler Inhale 2 puffs into the lungs 2 (two) times daily.     Cholecalciferol 50 MCG (2000 UT) TABS Take 2,000 Units by mouth daily.     fluticasone-salmeterol (ADVAIR) 250-50 MCG/ACT AEPB Inhale 1 puff into the lungs in the morning and at bedtime.     glipiZIDE (GLUCOTROL) 5 MG tablet Take 2.5 mg by mouth 2 (two) times daily before a meal.     metFORMIN (GLUCOPHAGE) 1000 MG tablet Take 0.5 tablets (500 mg total) by mouth 2 (two) times daily.     mirtazapine (REMERON) 15 MG tablet Take 1 tablet by mouth at bedtime.     nicotine polacrilex (COMMIT) 2 MG lozenge Take 2 mg by mouth daily  as needed for smoking cessation.     predniSONE (STERAPRED UNI-PAK 21 TAB) 10 MG (21) TBPK tablet Use per  package instruction 21 tablet 0   traZODone (DESYREL) 50 MG tablet Take 25 mg by mouth at bedtime.     Facility-Administered Medications Prior to Visit  Medication Dose Route Frequency Provider Last Rate Last Admin   sodium chloride flush (NS) 0.9 % injection 3 mL  3 mL Intravenous Q12H Jake Bathe, MD           Objective:   Physical Exam:  General appearance: 75 y.o., male, NAD, conversant  Eyes: anicteric sclerae, moist conjunctivae; no lid-lag; PERRL, tracking appropriately HENT: NCAT; oropharynx, MMM, no mucosal ulcerations; normal hard and soft palate Neck: Trachea midline; no lymphadenopathy, no JVD Lungs: Diminished bilaterally, no crackles, no wheeze, with normal respiratory effort CV: RRR, no MRGs  Abdomen: Soft, non-tender; non-distended, BS present  Extremities: No peripheral edema, radial and DP pulses present bilaterally  Skin: Normal temperature, turgor and texture; no rash Psych: Appropriate affect Neuro: Alert and oriented to person and place, no focal deficit    Vitals:   02/26/21 1033  BP: 140/72  Pulse: 77  Temp: 98.2 F (36.8 C)  TempSrc: Oral  SpO2: 95%  Weight: 150 lb (68 kg)  Height: 5\' 5"  (1.651 m)   95% on RA BMI Readings from Last 3 Encounters:  02/26/21 24.96 kg/m  01/28/21 26.78 kg/m  06/19/20 26.63 kg/m   Wt Readings from Last 3 Encounters:  02/26/21 150 lb (68 kg)  01/28/21 160 lb 15 oz (73 kg)  06/19/20 160 lb (72.6 kg)     CBC    Component Value Date/Time   WBC 14.2 (H) 01/28/2021 0105   RBC 5.13 01/28/2021 0105   HGB 16.5 01/28/2021 0105   HGB 16.8 06/15/2020 1040   HCT 50.1 01/28/2021 0105   HCT 50.1 06/15/2020 1040   PLT 177 01/28/2021 0105   PLT 192 06/15/2020 1040   MCV 97.7 01/28/2021 0105   MCV 97 06/15/2020 1040   MCH 32.2 01/28/2021 0105   MCHC 32.9 01/28/2021 0105   RDW 11.9 01/28/2021 0105   RDW  11.0 (L) 06/15/2020 1040   LYMPHSABS 1.8 06/04/2009 1450   MONOABS 0.9 06/04/2009 1450   EOSABS 0.2 06/04/2009 1450   BASOSABS 0.0 06/04/2009 1450    Chest Imaging: CXR 01/25/21 reviewed by me remarkable for hyperinflation  CT Coronaries lung bases 06/11/20 reviewed by me remarkable for advanced centrilobular emphysema  Pulmonary Functions Testing Results: No flowsheet data found.  Echocardiogram:   TTE 06/22/20 with G1DD      Assessment & Plan:   # COPD Gold functional group D:    # Smoking: # Pulmonary nodules: Followed in lung cancer screening program at [place]. Last CT Chest he thinks was in June or July of this year.   Plan: - start high dose trelegy, stop all other controller inhalers - PFTs next visit - discuss pulmonary rehab next visit - lung cancer screening referral, request [facility] from this past [facility] - ongoing smoking cessation encouraged     Omar Person, MD [facility] 02/26/2021 11:12 AM

## 2021-02-26 ENCOUNTER — Ambulatory Visit (INDEPENDENT_AMBULATORY_CARE_PROVIDER_SITE_OTHER): Payer: No Typology Code available for payment source | Admitting: Student

## 2021-02-26 ENCOUNTER — Other Ambulatory Visit: Payer: Self-pay

## 2021-02-26 ENCOUNTER — Encounter: Payer: Self-pay | Admitting: Student

## 2021-02-26 VITALS — BP 140/72 | HR 77 | Temp 98.2°F | Ht 65.0 in | Wt 150.0 lb

## 2021-02-26 DIAGNOSIS — J449 Chronic obstructive pulmonary disease, unspecified: Secondary | ICD-10-CM

## 2021-02-26 MED ORDER — TRELEGY ELLIPTA 200-62.5-25 MCG/INH IN AEPB: 1.0000 | INHALATION_SPRAY | Freq: Every day | RESPIRATORY_TRACT | 11 refills | Status: DC

## 2021-02-26 MED ORDER — TRELEGY ELLIPTA 200-62.5-25 MCG/INH IN AEPB: 1.0000 | INHALATION_SPRAY | Freq: Every day | RESPIRATORY_TRACT | 0 refills | Status: DC

## 2021-02-26 NOTE — Patient Instructions (Addendum)
-   Trelegy 1 puff once daily. Stop symbicort, stop advair, stop wixela. - PFTs before next visit - lung cancer screening referral placed today - return in 6 months

## 2021-02-26 NOTE — Addendum Note (Signed)
Addended by: Katrinka Blazing R on: 02/26/2021 04:37 PM   Modules accepted: Orders

## 2021-03-02 DIAGNOSIS — J9600 Acute respiratory failure, unspecified whether with hypoxia or hypercapnia: Secondary | ICD-10-CM | POA: Diagnosis not present

## 2021-03-02 DIAGNOSIS — J441 Chronic obstructive pulmonary disease with (acute) exacerbation: Secondary | ICD-10-CM | POA: Diagnosis not present

## 2021-04-02 DIAGNOSIS — J9600 Acute respiratory failure, unspecified whether with hypoxia or hypercapnia: Secondary | ICD-10-CM | POA: Diagnosis not present

## 2021-04-02 DIAGNOSIS — J441 Chronic obstructive pulmonary disease with (acute) exacerbation: Secondary | ICD-10-CM | POA: Diagnosis not present

## 2021-05-02 DIAGNOSIS — J441 Chronic obstructive pulmonary disease with (acute) exacerbation: Secondary | ICD-10-CM | POA: Diagnosis not present

## 2021-05-02 DIAGNOSIS — J9600 Acute respiratory failure, unspecified whether with hypoxia or hypercapnia: Secondary | ICD-10-CM | POA: Diagnosis not present

## 2021-05-03 DIAGNOSIS — E119 Type 2 diabetes mellitus without complications: Secondary | ICD-10-CM | POA: Diagnosis not present

## 2021-05-03 DIAGNOSIS — J449 Chronic obstructive pulmonary disease, unspecified: Secondary | ICD-10-CM | POA: Diagnosis not present

## 2021-05-03 DIAGNOSIS — E7800 Pure hypercholesterolemia, unspecified: Secondary | ICD-10-CM | POA: Diagnosis not present

## 2021-05-03 DIAGNOSIS — I10 Essential (primary) hypertension: Secondary | ICD-10-CM | POA: Diagnosis not present

## 2021-06-02 DIAGNOSIS — J9600 Acute respiratory failure, unspecified whether with hypoxia or hypercapnia: Secondary | ICD-10-CM | POA: Diagnosis not present

## 2021-06-02 DIAGNOSIS — J441 Chronic obstructive pulmonary disease with (acute) exacerbation: Secondary | ICD-10-CM | POA: Diagnosis not present

## 2021-07-03 DIAGNOSIS — J9600 Acute respiratory failure, unspecified whether with hypoxia or hypercapnia: Secondary | ICD-10-CM | POA: Diagnosis not present

## 2021-07-03 DIAGNOSIS — J441 Chronic obstructive pulmonary disease with (acute) exacerbation: Secondary | ICD-10-CM | POA: Diagnosis not present

## 2021-07-31 DIAGNOSIS — J441 Chronic obstructive pulmonary disease with (acute) exacerbation: Secondary | ICD-10-CM | POA: Diagnosis not present

## 2021-07-31 DIAGNOSIS — J9600 Acute respiratory failure, unspecified whether with hypoxia or hypercapnia: Secondary | ICD-10-CM | POA: Diagnosis not present

## 2021-08-01 DIAGNOSIS — I10 Essential (primary) hypertension: Secondary | ICD-10-CM | POA: Diagnosis not present

## 2021-08-01 DIAGNOSIS — J449 Chronic obstructive pulmonary disease, unspecified: Secondary | ICD-10-CM | POA: Diagnosis not present

## 2021-08-01 DIAGNOSIS — E119 Type 2 diabetes mellitus without complications: Secondary | ICD-10-CM | POA: Diagnosis not present

## 2021-08-01 DIAGNOSIS — E7800 Pure hypercholesterolemia, unspecified: Secondary | ICD-10-CM | POA: Diagnosis not present

## 2021-08-31 DIAGNOSIS — J441 Chronic obstructive pulmonary disease with (acute) exacerbation: Secondary | ICD-10-CM | POA: Diagnosis not present

## 2021-08-31 DIAGNOSIS — J9600 Acute respiratory failure, unspecified whether with hypoxia or hypercapnia: Secondary | ICD-10-CM | POA: Diagnosis not present

## 2021-09-04 DIAGNOSIS — J449 Chronic obstructive pulmonary disease, unspecified: Secondary | ICD-10-CM | POA: Diagnosis not present

## 2021-09-04 DIAGNOSIS — E113399 Type 2 diabetes mellitus with moderate nonproliferative diabetic retinopathy without macular edema, unspecified eye: Secondary | ICD-10-CM | POA: Diagnosis not present

## 2021-09-04 DIAGNOSIS — Z0000 Encounter for general adult medical examination without abnormal findings: Secondary | ICD-10-CM | POA: Diagnosis not present

## 2021-09-04 DIAGNOSIS — E7800 Pure hypercholesterolemia, unspecified: Secondary | ICD-10-CM | POA: Diagnosis not present

## 2021-09-04 DIAGNOSIS — E11319 Type 2 diabetes mellitus with unspecified diabetic retinopathy without macular edema: Secondary | ICD-10-CM | POA: Diagnosis not present

## 2021-09-04 DIAGNOSIS — I10 Essential (primary) hypertension: Secondary | ICD-10-CM | POA: Diagnosis not present

## 2021-09-04 DIAGNOSIS — Z79899 Other long term (current) drug therapy: Secondary | ICD-10-CM | POA: Diagnosis not present

## 2021-09-15 ENCOUNTER — Other Ambulatory Visit: Payer: Self-pay

## 2021-09-15 ENCOUNTER — Encounter (HOSPITAL_COMMUNITY): Payer: Self-pay

## 2021-09-15 ENCOUNTER — Observation Stay (HOSPITAL_COMMUNITY)
Admission: EM | Admit: 2021-09-15 | Discharge: 2021-09-17 | Disposition: A | Discharge status: Home / Self Care | Payer: No Typology Code available for payment source | Attending: Family Medicine | Admitting: Family Medicine

## 2021-09-15 ENCOUNTER — Emergency Department (HOSPITAL_COMMUNITY): Payer: No Typology Code available for payment source

## 2021-09-15 DIAGNOSIS — J189 Pneumonia, unspecified organism: Secondary | ICD-10-CM | POA: Diagnosis not present

## 2021-09-15 DIAGNOSIS — J441 Chronic obstructive pulmonary disease with (acute) exacerbation: Secondary | ICD-10-CM | POA: Diagnosis not present

## 2021-09-15 DIAGNOSIS — R0602 Shortness of breath: Secondary | ICD-10-CM | POA: Diagnosis present

## 2021-09-15 DIAGNOSIS — Z20822 Contact with and (suspected) exposure to covid-19: Secondary | ICD-10-CM | POA: Diagnosis not present

## 2021-09-15 DIAGNOSIS — R6520 Severe sepsis without septic shock: Secondary | ICD-10-CM | POA: Insufficient documentation

## 2021-09-15 DIAGNOSIS — Z7984 Long term (current) use of oral hypoglycemic drugs: Secondary | ICD-10-CM | POA: Diagnosis not present

## 2021-09-15 DIAGNOSIS — Z7982 Long term (current) use of aspirin: Secondary | ICD-10-CM | POA: Insufficient documentation

## 2021-09-15 DIAGNOSIS — A419 Sepsis, unspecified organism: Secondary | ICD-10-CM | POA: Diagnosis not present

## 2021-09-15 DIAGNOSIS — X31XXXA Exposure to excessive natural cold, initial encounter: Secondary | ICD-10-CM | POA: Diagnosis not present

## 2021-09-15 DIAGNOSIS — E11649 Type 2 diabetes mellitus with hypoglycemia without coma: Secondary | ICD-10-CM | POA: Diagnosis not present

## 2021-09-15 DIAGNOSIS — Z79899 Other long term (current) drug therapy: Secondary | ICD-10-CM | POA: Insufficient documentation

## 2021-09-15 DIAGNOSIS — T68XXXA Hypothermia, initial encounter: Secondary | ICD-10-CM | POA: Insufficient documentation

## 2021-09-15 DIAGNOSIS — Z87891 Personal history of nicotine dependence: Secondary | ICD-10-CM | POA: Insufficient documentation

## 2021-09-15 DIAGNOSIS — J9602 Acute respiratory failure with hypercapnia: Secondary | ICD-10-CM | POA: Insufficient documentation

## 2021-09-15 LAB — BASIC METABOLIC PANEL
Anion gap: 12 (ref 5–15)
BUN: 17 mg/dL (ref 8–23)
CO2: 26 mmol/L (ref 22–32)
Calcium: 9.7 mg/dL (ref 8.9–10.3)
Chloride: 104 mmol/L (ref 98–111)
Creatinine, Ser: 0.93 mg/dL (ref 0.61–1.24)
GFR, Estimated: >60 mL/min (ref >60)
Glucose, Bld: 121 mg/dL — ABNORMAL HIGH (ref 70–99)
Potassium: 4 mmol/L (ref 3.5–5.1)
Sodium: 142 mmol/L (ref 135–145)

## 2021-09-15 LAB — CBC
HCT: 56.2 % — ABNORMAL HIGH (ref 39.0–52.0)
Hemoglobin: 18.6 g/dL — ABNORMAL HIGH (ref 13.0–17.0)
MCH: 33.4 pg (ref 26.0–34.0)
MCHC: 33.1 g/dL (ref 30.0–36.0)
MCV: 100.9 fL — ABNORMAL HIGH (ref 80.0–100.0)
Platelets: 228 10*3/uL (ref 150–400)
RBC: 5.57 MIL/uL (ref 4.22–5.81)
RDW: 11.9 % (ref 11.5–15.5)
WBC: 23.5 10*3/uL — ABNORMAL HIGH (ref 4.0–10.5)
nRBC: 0 % (ref 0.0–0.2)

## 2021-09-15 LAB — LACTIC ACID, PLASMA: Lactic Acid, Venous: 5.3 mmol/L (ref 0.5–1.9)

## 2021-09-15 LAB — LIPASE, BLOOD: Lipase: 39 U/L (ref 11–51)

## 2021-09-15 LAB — BLOOD GAS, VENOUS
Acid-Base Excess: 0.9 mmol/L (ref 0.0–2.0)
Bicarbonate: 29.4 mmol/L — ABNORMAL HIGH (ref 20.0–28.0)
O2 Saturation: 30.3 %
Patient temperature: 37.1
pCO2, Ven: 64 mmHg — ABNORMAL HIGH (ref 44–60)
pH, Ven: 7.27 (ref 7.25–7.43)
pO2, Ven: <31 mmHg — CL (ref 32–45)

## 2021-09-15 LAB — HEPATIC FUNCTION PANEL
ALT: 19 U/L (ref 0–44)
AST: 19 U/L (ref 15–41)
Albumin: 4.6 g/dL (ref 3.5–5.0)
Alkaline Phosphatase: 79 U/L (ref 38–126)
Bilirubin, Direct: 0.1 mg/dL (ref 0.0–0.2)
Indirect Bilirubin: 0.6 mg/dL (ref 0.3–0.9)
Total Bilirubin: 0.7 mg/dL (ref 0.3–1.2)
Total Protein: 8.1 g/dL (ref 6.5–8.1)

## 2021-09-15 LAB — TSH: TSH: 3.672 u[IU]/mL (ref 0.350–4.500)

## 2021-09-15 LAB — PROTIME-INR
INR: 1.2 (ref 0.8–1.2)
Prothrombin Time: 14.7 seconds (ref 11.4–15.2)

## 2021-09-15 LAB — CBG MONITORING, ED: Glucose-Capillary: 100 mg/dL — ABNORMAL HIGH (ref 70–99)

## 2021-09-15 MED ORDER — SODIUM CHLORIDE 0.9 % IV SOLN
500.0000 mg | INTRAVENOUS | Status: DC
  Administered 2021-09-16 – 2021-09-17 (×2): 500 mg via INTRAVENOUS
  Filled 2021-09-15 (×2): qty 5

## 2021-09-15 MED ORDER — ALBUTEROL SULFATE HFA 108 (90 BASE) MCG/ACT IN AERS
2.0000 | INHALATION_SPRAY | RESPIRATORY_TRACT | Status: DC | PRN
  Administered 2021-09-15: 2 via RESPIRATORY_TRACT
  Filled 2021-09-15: qty 6.7

## 2021-09-15 MED ORDER — ONDANSETRON HCL 4 MG/2ML IJ SOLN
4.0000 mg | Freq: Once | INTRAMUSCULAR | Status: AC | PRN
  Administered 2021-09-15: 4 mg via INTRAVENOUS
  Filled 2021-09-15: qty 2

## 2021-09-15 MED ORDER — SODIUM CHLORIDE 0.9 % IV BOLUS
1000.0000 mL | Freq: Once | INTRAVENOUS | Status: AC
  Administered 2021-09-15: 1000 mL via INTRAVENOUS

## 2021-09-15 MED ORDER — SODIUM CHLORIDE 0.9 % IV SOLN
2.0000 g | INTRAVENOUS | Status: DC
  Administered 2021-09-15 – 2021-09-16 (×2): 2 g via INTRAVENOUS
  Filled 2021-09-15 (×2): qty 20

## 2021-09-15 MED ORDER — LACTATED RINGERS IV BOLUS
1000.0000 mL | Freq: Once | INTRAVENOUS | Status: AC
  Administered 2021-09-15: 1000 mL via INTRAVENOUS

## 2021-09-15 MED ORDER — IPRATROPIUM-ALBUTEROL 0.5-2.5 (3) MG/3ML IN SOLN
3.0000 mL | Freq: Once | RESPIRATORY_TRACT | Status: AC
  Administered 2021-09-15: 3 mL via RESPIRATORY_TRACT
  Filled 2021-09-15: qty 3

## 2021-09-15 NOTE — ED Notes (Signed)
All blood cultures collected, however unable to fill container to the fill line.  ?

## 2021-09-15 NOTE — ED Notes (Signed)
Called RT for Bipap

## 2021-09-15 NOTE — ED Provider Notes (Addendum)
?[facility]-EMERGENCY DEPT ?Provider Note ? ? ?CSN: [id number] ?Arrival date & time: 09/15/21  2124 ? ?  ? ?History ? ?Chief Complaint  ?Patient presents with  ? Shortness of Breath  ? ? ?George Collins is a 76 y.o. male. ? ?HPI ? ?Patient with medical history including type 2 diabetes, COPD, current smoker presents to the emergency department with complaints of feeling short of breath.  Patient states that he has not been feeling well for the last 2 days, states he went to a family party yesterday and was started to feel very cold, he states he woke up this morning ate breakfast and then lost his appetite he started become weak all over, not unilateral, states that he felt slightly short of breath he called EMS he noted that his blood sugar was 52.  He then told that he was feeling short of breath they did provide him with some medicine and they brought him here.  He is currently having no complaints at this time, he denies headaches, change in vision, paresthesias in the upper or lower extremities, denies any chest pain, worsening shortness of breath, stomach pains constipation diarrhea.  States he had an episode of vomiting today without any stomach pain, he has no severe abdominal history.  He has no other complaints.  He is not immunocompromise, up-to-date on all childhood vaccines, had his COVID influenza vaccine. ? ?Wife is at bedside who provides the story, she states that patient has been endorsing he has been cold for last 2 days, she also notes that he seems generalized weak, she does endorse that he is having some difficulty breathing earlier in the day she thought that he was using his belly to breathe.  He uses at home oxygen only at nighttime. ? ?EMS had provided patient with a DuoNeb 125 mg of Solu-Medrol. ? ?Home Medications ?Prior to Admission medications   ?Medication Sig Start Date End Date Taking? Authorizing Provider  ?acetaminophen (TYLENOL) 500 MG tablet Take 500 mg by  mouth every 6 (six) hours as needed for moderate pain or headache.    [provider]  ?albuterol (VENTOLIN HFA) 108 (90 Base) MCG/ACT inhaler Inhale 2 puffs into the lungs every 6 (six) hours as needed for wheezing or shortness of breath.    [provider]  ?aspirin 81 MG EC tablet Take 1 tablet (81 mg total) by mouth daily. 01/28/21   Elgergawy, Leana Roe, MD  ?atorvastatin (LIPITOR) 10 MG tablet Take 10 mg by mouth daily. 11/20/20   [provider]  ?Cholecalciferol 50 MCG (2000 UT) TABS Take 2,000 Units by mouth daily. 11/20/20   [provider]  ?Fluticasone-Umeclidin-Vilant (TRELEGY ELLIPTA) 200-62.5-25 MCG/INH AEPB Inhale 1 puff into the lungs daily. 02/26/21   Omar Person, MD  ?glipiZIDE (GLUCOTROL) 5 MG tablet Take 2.5 mg by mouth 2 (two) times daily before a meal. 08/20/20   [provider]  ?metFORMIN (GLUCOPHAGE) 1000 MG tablet Take 0.5 tablets (500 mg total) by mouth 2 (two) times daily. 06/21/20   Corky Crafts, MD  ?mirtazapine (REMERON) 15 MG tablet Take 1 tablet by mouth at bedtime. 08/20/20   [provider]  ?nicotine polacrilex (COMMIT) 2 MG lozenge Take 2 mg by mouth daily as needed for smoking cessation. 09/11/20   [provider]  ?predniSONE (STERAPRED UNI-PAK 21 TAB) 10 MG (21) TBPK tablet Use per  package instruction 01/28/21   Elgergawy, Leana Roe, MD  ?traZODone (DESYREL) 50 MG tablet Take  25 mg by mouth at bedtime. 01/15/21   [provider]  ?   ? ?Allergies    ?Patient has no known allergies.   ? ?Review of Systems   ?Review of Systems  ?Constitutional:  Positive for chills. Negative for fever.  ?Respiratory:  Positive for shortness of breath.   ?Cardiovascular:  Negative for chest pain.  ?Gastrointestinal:  Positive for nausea. Negative for abdominal pain.  ?Neurological:  Negative for headaches.  ? ?Physical Exam ?Updated Vital Signs ?BP (!) 106/54   Pulse 76   Temp 98.4 ?F (36.9 ?C) (Oral)   Resp 19   Ht 5'  5" (1.651 m)   Wt 70.3 kg   SpO2 94%   BMI 25.79 kg/m?  ?Physical Exam ?Vitals and nursing note reviewed.  ?Constitutional:   ?   General: He is not in acute distress. ?   Appearance: He is not ill-appearing.  ?HENT:  ?   Head: Normocephalic and atraumatic.  ?   Ears:  ?   Comments: No deformity of the head present, no raccoon eyes or battle sign noted. ?   Nose: No congestion.  ?   Mouth/Throat:  ?   Mouth: Mucous membranes are moist.  ?   Pharynx: Oropharynx is clear.  ?   Comments: No trismus no torticollis no oral trauma present. ?Eyes:  ?   Extraocular Movements: Extraocular movements intact.  ?   Conjunctiva/sclera: Conjunctivae normal.  ?   Pupils: Pupils are equal, round, and reactive to light.  ?Cardiovascular:  ?   Rate and Rhythm: Normal rate and regular rhythm.  ?   Pulses: Normal pulses.  ?   Heart sounds: No murmur heard. ?  No friction rub. No gallop.  ?Pulmonary:  ?   Effort: No respiratory distress.  ?   Breath sounds: No wheezing, rhonchi or rales.  ?   Comments: No evidence of respiratory distress nontachypneic nonhypoxic speaking full sentences, no accessory muscle usage, patient had noted diminished lung sounds in the lower lobes bilaterally, no rales rhonchi or stridor present. ?Abdominal:  ?   Palpations: Abdomen is soft.  ?   Tenderness: There is no abdominal tenderness. There is no right CVA tenderness or left CVA tenderness.  ?Musculoskeletal:  ?   Right lower leg: No edema.  ?   Left lower leg: No edema.  ?Skin: ?   General: Skin is warm and dry.  ?Neurological:  ?   Mental Status: He is alert.  ?   Comments: No facial asymmetry, no difficulty with word finding, following two-step commands, no unilateral weakness present.  ?Psychiatric:     ?   Mood and Affect: Mood normal.  ? ? ?ED Results / Procedures / Treatments   ?Labs ?(all labs ordered are listed, but only abnormal results are displayed) ?Labs Reviewed  ?BASIC METABOLIC PANEL - Abnormal; Notable for the following components:  ?     Result Value  ? Glucose, Bld 121 (*)   ? All other components within normal limits  ?CBC - Abnormal; Notable for the following components:  ? WBC 23.5 (*)   ? Hemoglobin 18.6 (*)   ? HCT 56.2 (*)   ? MCV 100.9 (*)   ? All other components within normal limits  ?LACTIC ACID, PLASMA - Abnormal; Notable for the following components:  ? Lactic Acid, Venous 5.3 (*)   ? All other components within normal limits  ?LACTIC ACID, PLASMA - Abnormal; Notable for the following components:  ? Lactic  Acid, Venous 3.7 (*)   ? All other components within normal limits  ?URINALYSIS, ROUTINE W REFLEX MICROSCOPIC - Abnormal; Notable for the following components:  ? Glucose, UA >=500 (*)   ? Ketones, ur 20 (*)   ? All other components within normal limits  ?BLOOD GAS, VENOUS - Abnormal; Notable for the following components:  ? pCO2, Ven 64 (*)   ? pO2, Ven <31 (*)   ? Bicarbonate 29.4 (*)   ? All other components within normal limits  ?BLOOD GAS, VENOUS - Abnormal; Notable for the following components:  ? pO2, Ven 76 (*)   ? Acid-base deficit 2.8 (*)   ? All other components within normal limits  ?CBG MONITORING, ED - Abnormal; Notable for the following components:  ? Glucose-Capillary 100 (*)   ? All other components within normal limits  ?CULTURE, BLOOD (ROUTINE X 2)  ?CULTURE, BLOOD (ROUTINE X 2)  ?RESP PANEL BY RT-PCR (FLU A&B, COVID) ARPGX2  ?PROTIME-INR  ?LIPASE, BLOOD  ?HEPATIC FUNCTION PANEL  ?TSH  ?CBC  ? ? ?EKG ?None ? ?Radiology ?DG Chest Port 1 View ? ?Result Date: 09/15/2021 ?CLINICAL DATA:  Shortness of breath. EXAM: PORTABLE CHEST 1 VIEW COMPARISON:  01/25/2021.  Cardiac CT 06/11/2020 FINDINGS: Mild streaky opacity at the left lung base.The cardiomediastinal contours are normal. Similar hilar prominence to prior exam. Aortic atherosclerosis. The lungs are hyperinflated with paucity of upper lung markings typical of emphysema. Pulmonary vasculature is normal. No pleural effusion or pneumothorax. No acute osseous  abnormalities are seen. IMPRESSION: 1. Mild streaky opacity at the left lung base may be atelectasis or scarring. Pneumonia could have a similar appearance in the appropriate clinical setting. 2. Emphysema. Electronical

## 2021-09-15 NOTE — ED Triage Notes (Signed)
Patient BIB [facility] from home. Has not been feeling good the past few days, feeling weak. CBG 52 by fire department, drank juice and ate PB sandwich. Then came up to 87. Not disoriented. Took metformin today at 1pm and ate lunch with it. Then when moving onto the stretcher with EMS he started feeling short of breath, diminished lung sounds. Got a duoneb and 125mg  solumedrol. 20G Left AC.  ?

## 2021-09-16 DIAGNOSIS — J189 Pneumonia, unspecified organism: Secondary | ICD-10-CM | POA: Diagnosis present

## 2021-09-16 DIAGNOSIS — J9602 Acute respiratory failure with hypercapnia: Secondary | ICD-10-CM

## 2021-09-16 LAB — CBC
HCT: 47.7 % (ref 39.0–52.0)
Hemoglobin: 16 g/dL (ref 13.0–17.0)
MCH: 33 pg (ref 26.0–34.0)
MCHC: 33.5 g/dL (ref 30.0–36.0)
MCV: 98.4 fL (ref 80.0–100.0)
Platelets: 187 10*3/uL (ref 150–400)
RBC: 4.85 MIL/uL (ref 4.22–5.81)
RDW: 11.8 % (ref 11.5–15.5)
WBC: 8.1 10*3/uL (ref 4.0–10.5)
nRBC: 0 % (ref 0.0–0.2)

## 2021-09-16 LAB — URINALYSIS, ROUTINE W REFLEX MICROSCOPIC
Bacteria, UA: NONE SEEN
Bilirubin Urine: NEGATIVE
Glucose, UA: >=500 mg/dL — AB
Hgb urine dipstick: NEGATIVE
Ketones, ur: 20 mg/dL — AB
Leukocytes,Ua: NEGATIVE
Nitrite: NEGATIVE
Protein, ur: NEGATIVE mg/dL
Specific Gravity, Urine: 1.017 (ref 1.005–1.030)
pH: 5 (ref 5.0–8.0)

## 2021-09-16 LAB — HEMOGLOBIN A1C
Hgb A1c MFr Bld: 6.3 % — ABNORMAL HIGH (ref 4.8–5.6)
Mean Plasma Glucose: 134.11 mg/dL

## 2021-09-16 LAB — GLUCOSE, CAPILLARY: Glucose-Capillary: 130 mg/dL — ABNORMAL HIGH (ref 70–99)

## 2021-09-16 LAB — BLOOD GAS, VENOUS
Acid-base deficit: 2.8 mmol/L — ABNORMAL HIGH (ref 0.0–2.0)
Bicarbonate: 23.2 mmol/L (ref 20.0–28.0)
O2 Saturation: 97.9 %
Patient temperature: 37
pCO2, Ven: 44 mmHg (ref 44–60)
pH, Ven: 7.33 (ref 7.25–7.43)
pO2, Ven: 76 mmHg — ABNORMAL HIGH (ref 32–45)

## 2021-09-16 LAB — CBG MONITORING, ED
Glucose-Capillary: 231 mg/dL — ABNORMAL HIGH (ref 70–99)
Glucose-Capillary: 277 mg/dL — ABNORMAL HIGH (ref 70–99)

## 2021-09-16 LAB — RESP PANEL BY RT-PCR (FLU A&B, COVID) ARPGX2
Influenza A by PCR: NEGATIVE
Influenza B by PCR: NEGATIVE
SARS Coronavirus 2 by RT PCR: NEGATIVE

## 2021-09-16 LAB — LACTIC ACID, PLASMA
Lactic Acid, Venous: 3.7 mmol/L (ref 0.5–1.9)
Lactic Acid, Venous: 1.7 mmol/L (ref 0.5–1.9)

## 2021-09-16 MED ORDER — INSULIN ASPART 100 UNIT/ML IJ SOLN
0.0000 [IU] | Freq: Three times a day (TID) | INTRAMUSCULAR | Status: DC
  Administered 2021-09-16: 1 [IU] via SUBCUTANEOUS

## 2021-09-16 MED ORDER — UMECLIDINIUM BROMIDE 62.5 MCG/ACT IN AEPB
1.0000 | INHALATION_SPRAY | Freq: Every day | RESPIRATORY_TRACT | Status: DC
  Administered 2021-09-16 – 2021-09-17 (×2): 1 via RESPIRATORY_TRACT
  Filled 2021-09-16: qty 7

## 2021-09-16 MED ORDER — IPRATROPIUM-ALBUTEROL 0.5-2.5 (3) MG/3ML IN SOLN: 3.0000 mL | Freq: Four times a day (QID) | RESPIRATORY_TRACT | Status: DC | PRN

## 2021-09-16 MED ORDER — ENOXAPARIN SODIUM 40 MG/0.4ML IJ SOSY
40.0000 mg | PREFILLED_SYRINGE | INTRAMUSCULAR | Status: DC
  Administered 2021-09-16 – 2021-09-17 (×2): 40 mg via SUBCUTANEOUS
  Filled 2021-09-16 (×2): qty 0.4

## 2021-09-16 MED ORDER — FLUTICASONE FUROATE-VILANTEROL 200-25 MCG/ACT IN AEPB
1.0000 | INHALATION_SPRAY | Freq: Every day | RESPIRATORY_TRACT | Status: DC
Start: 2021-09-16 — End: 2021-09-17
  Administered 2021-09-16 – 2021-09-17 (×2): 1 via RESPIRATORY_TRACT
  Filled 2021-09-16: qty 28

## 2021-09-16 MED ORDER — ACETAMINOPHEN 650 MG RE SUPP: 650.0000 mg | Freq: Four times a day (QID) | RECTAL | Status: DC | PRN

## 2021-09-16 MED ORDER — ACETAMINOPHEN 325 MG PO TABS
650.0000 mg | ORAL_TABLET | Freq: Four times a day (QID) | ORAL | Status: DC | PRN
  Filled 2021-09-16: qty 2

## 2021-09-16 NOTE — Progress Notes (Signed)
Subjective: ?Patient admitted this morning, see detailed H&P by Dr Loney Loh ?76 year old male with medical history of hyperlipidemia, diabetes mellitus type 2, COPD came to ED for evaluation of shortness of breath, generalized weakness and hypoglycemia.  He was found to be in COPD exacerbation started on DuoNeb, Solu-Medrol lactic acid was elevated 5.3> 3.7.  Blood cultures were drawn.  WBC elevated at 23.5.  Influenza and COVID-19 virus PCR are negative. ?Chest x-ray showed mild streaky opacity at left lung base which may be atelectasis or scarring.  Pneumonia could have similar appearance in the appropriate clinical setting. ? ?Patient started on ceftriaxone anthramycin ? ?Vitals:  ? 09/16/21 1130 09/16/21 1201  ?BP: 106/89 (!) 149/73  ?Pulse: 71 69  ?Resp: 19 18  ?Temp: (!) 97.5 ?F (36.4 ?C) (!) 97.4 ?F (36.3 ?C)  ?SpO2: 94% 99%  ? ? ? ? ?A/P ? ?Severe sepsis due to community-acquired pneumonia ?-Patient presented with severe sepsis with hypothermia ?-Labs showed leukocytosis and lactic acidosis ?-Lactic acid improved to 3.7, will repeat lactic acid ?-Started on ceftriaxone anthramycin ?-WBC down to 8.1 ?-Follow blood culture results ? ?COPD exacerbation ?-Was placed on BiPAP in the ED due to mild hypercapnia ?-Repeat ABG showed normal PaCO2 ?-Continue as needed DuoNebs ? ?Diabetes mellitus type 2/hyperglycemia ?-We will start sliding scale insulin with NovoLog ?-Check CBG before every meal and at bedtime ? ?Hypothermia ?-Resolved, likely from severe sepsis ?-TSH normal ? ? ?Meredeth Ide ?[facility] ?Pager- [phone]  ?

## 2021-09-16 NOTE — H&P (Signed)
?History and Physical  ? ? ?[name] [id number] DOB: [birth year] DOA: 09/15/2021 ? ?PCP: Glenis Smoker, MD ? ?Patient coming from: Home ? ?Chief Complaint: Shortness of breath ? ?HPI: George Collins is a 76 y.o. male with medical history significant of hyperlipidemia, type 2 diabetes, COPD presented to the ED for evaluation of shortness of breath, generalized weakness, and hypoglycemia.  Blood glucose 52 with EMS, improved 87 after patient was given juice and a sandwich.  Patient was given DuoNeb and Solu-Medrol 125 mg. On arrival to the ED, temperature 93.1 ?F and remainder of vital signs stable.  Labs showing WBC 23.5.  Hemoglobin 18.6.  Lactic acid 5.3 >3.7.  Blood cultures drawn.  TSH normal.  COVID and influenza PCR pending.  No hypoxia or respiratory distress.  Initial VBG showed mild hypercapnia and was placed on BiPAP.  Repeat VBG showing normal PCO2 and was taken off of BiPAP.  Chest x-ray showing mild streaky opacity at the left lung base concerning for possible pneumonia.  Patient was given DuoNeb, ceftriaxone, azithromycin, albuterol, Zofran, and 2 L IV fluid boluses. ? ?Patient states he went to a cookout on Saturday night and after coming back vomited and felt cold.  He also had runny nose and a cough.  No abdominal pain or diarrhea.  Yesterday he stayed in bed all day as he did not have any energy.  He did have lunch but did not have energy to eat dinner.  He takes glipizide and states his PCP had told him in the past to stop taking it as it was causing his blood sugar to be low.  He did stop glipizide for some time but then started taking it again.  Reports chronic shortness of breath due to COPD but thinks his breathing is now worse from baseline.  He is not wheezing.  Not having any chest pain or fevers.  He uses 2 L oxygen as needed. ? ?Review of Systems:  ?Review of Systems  ?All other systems reviewed and are negative. ? ?Past Medical History:  ?Diagnosis Date  ?  Hyperlipidemia 01/26/2021  ? Personal history of rectal adenoma 08/09/2009  ? Type 2 diabetes mellitus ([place]) 01/26/2021  ? ? ?Past Surgical History:  ?Procedure Laterality Date  ? BACK SURGERY    ? NECK SURGERY    ? RIGHT/LEFT HEART CATH AND CORONARY ANGIOGRAPHY N/A 06/19/2020  ? Procedure: RIGHT/LEFT HEART CATH AND CORONARY ANGIOGRAPHY;  Surgeon: Jettie Booze, MD;  Location: [facility];  Service: Cardiovascular;  Laterality: N/A;  ? ? ? reports that he quit smoking about 7 months ago. His smoking use included cigarettes. He has a 15.00 pack-year smoking history. He has never used smokeless tobacco. He reports current alcohol use. He reports that he does not use drugs. ? ?No Known Allergies ? ?Family History  ?Problem Relation Age of Onset  ? Hypertension Other   ? ? ?Prior to Admission medications   ?Medication Sig Start Date End Date Taking? Authorizing Provider  ?acetaminophen (TYLENOL) 500 MG tablet Take 500 mg by mouth every 6 (six) hours as needed for moderate pain or headache.    [provider]  ?albuterol (VENTOLIN HFA) 108 (90 Base) MCG/ACT inhaler Inhale 2 puffs into the lungs every 6 (six) hours as needed for wheezing or shortness of breath.    [provider]  ?aspirin 81 MG EC tablet Take 1 tablet (81 mg total) by mouth daily. 01/28/21   Elgergawy, Silver Huguenin, MD  ?  atorvastatin (LIPITOR) 10 MG tablet Take 10 mg by mouth daily. 11/20/20   [provider]  ?Cholecalciferol 50 MCG (2000 UT) TABS Take 2,000 Units by mouth daily. 11/20/20   [provider]  ?Fluticasone-Umeclidin-Vilant (TRELEGY ELLIPTA) 200-62.5-25 MCG/INH AEPB Inhale 1 puff into the lungs daily. 02/26/21   Maryjane Hurter, MD  ?glipiZIDE (GLUCOTROL) 5 MG tablet Take 2.5 mg by mouth 2 (two) times daily before a meal. 08/20/20   [provider]  ?metFORMIN (GLUCOPHAGE) 1000 MG tablet Take 0.5 tablets (500 mg total) by mouth 2 (two) times daily. 06/21/20   Jettie Booze, MD  ?mirtazapine  (REMERON) 15 MG tablet Take 1 tablet by mouth at bedtime. 08/20/20   [provider]  ?nicotine polacrilex (COMMIT) 2 MG lozenge Take 2 mg by mouth daily as needed for smoking cessation. 09/11/20   [provider]  ?predniSONE (STERAPRED UNI-PAK 21 TAB) 10 MG (21) TBPK tablet Use per  package instruction 01/28/21   Elgergawy, Silver Huguenin, MD  ?traZODone (DESYREL) 50 MG tablet Take 25 mg by mouth at bedtime. 01/15/21   [provider]  ? ? ?Physical Exam: ?Vitals:  ? 09/16/21 0215 09/16/21 0246 09/16/21 0300 09/16/21 0330  ?BP: 107/63  (!) 100/56 (!) 101/56  ?Pulse: 85  84 84  ?Resp: 17  19 18   ?Temp:  97.9 ?F (36.6 ?C)    ?TempSrc:  Rectal    ?SpO2: 98%  91% 92%  ?Weight:      ?Height:      ? ? ?Physical Exam ?Vitals reviewed.  ?Constitutional:   ?   General: He is not in acute distress. ?HENT:  ?   Head: Normocephalic and atraumatic.  ?Eyes:  ?   Extraocular Movements: Extraocular movements intact.  ?Cardiovascular:  ?   Rate and Rhythm: Normal rate and regular rhythm.  ?   Pulses: Normal pulses.  ?Pulmonary:  ?   Effort: Pulmonary effort is normal. No respiratory distress.  ?   Breath sounds: No wheezing or rales.  ?Abdominal:  ?   General: Bowel sounds are normal. There is no distension.  ?   Palpations: Abdomen is soft.  ?   Tenderness: There is no abdominal tenderness. There is no guarding.  ?Musculoskeletal:     ?   General: No swelling or tenderness.  ?   Cervical back: Normal range of motion.  ?Skin: ?   General: Skin is warm and dry.  ?Neurological:  ?   General: No focal deficit present.  ?   Mental Status: He is alert and oriented to person, place, and time.  ?  ? ?Labs on Admission: I have personally reviewed following labs and imaging studies ? ?CBC: ?Recent Labs  ?Lab 09/15/21 ?2150  ?WBC 23.5*  ?HGB 18.6*  ?HCT 56.2*  ?MCV 100.9*  ?PLT 228  ? ?Basic Metabolic Panel: ?Recent Labs  ?Lab 09/15/21 ?2150  ?NA 142  ?K 4.0  ?CL 104  ?CO2 26  ?GLUCOSE 121*  ?BUN 17  ?CREATININE 0.93   ?CALCIUM 9.7  ? ?GFR: ?Estimated Creatinine Clearance: 61.5 mL/min (by C-G formula based on SCr of 0.93 mg/dL). ?Liver Function Tests: ?Recent Labs  ?Lab 09/15/21 ?2217  ?AST 19  ?ALT 19  ?ALKPHOS 79  ?BILITOT 0.7  ?PROT 8.1  ?ALBUMIN 4.6  ? ?Recent Labs  ?Lab 09/15/21 ?2150  ?LIPASE 39  ? ?No results for input(s): AMMONIA in the last 168 hours. ?Coagulation Profile: ?Recent Labs  ?Lab 09/15/21 ?2150  ?INR 1.2  ? ?  Cardiac Enzymes: ?No results for input(s): CKTOTAL, CKMB, CKMBINDEX, TROPONINI in the last 168 hours. ?BNP (last 3 results) ?No results for input(s): PROBNP in the last 8760 hours. ?HbA1C: ?No results for input(s): HGBA1C in the last 72 hours. ?CBG: ?Recent Labs  ?Lab 09/15/21 ?2145  ?GLUCAP 100*  ? ?Lipid Profile: ?No results for input(s): CHOL, HDL, LDLCALC, TRIG, CHOLHDL, LDLDIRECT in the last 72 hours. ?Thyroid Function Tests: ?Recent Labs  ?  09/15/21 ?2217  ?TSH 3.672  ? ?Anemia Panel: ?No results for input(s): VITAMINB12, FOLATE, FERRITIN, TIBC, IRON, RETICCTPCT in the last 72 hours. ?Urine analysis: ?   ?Component Value Date/Time  ? [place] YELLOW 09/16/2021 0037  ? APPEARANCEUR CLEAR 09/16/2021 0037  ? LABSPEC 1.017 09/16/2021 0037  ? PHURINE 5.0 09/16/2021 0037  ? GLUCOSEU >=500 (A) 09/16/2021 0037  ? [place] NEGATIVE 09/16/2021 0037  ? [place] NEGATIVE 09/16/2021 0037  ? KETONESUR 20 (A) 09/16/2021 0037  ? [place] NEGATIVE 09/16/2021 0037  ? NITRITE NEGATIVE 09/16/2021 0037  ? LEUKOCYTESUR NEGATIVE 09/16/2021 0037  ? ? ?Radiological Exams on Admission: I have personally reviewed images ?DG Chest Port 1 View ? ?Result Date: 09/15/2021 ?CLINICAL DATA:  Shortness of breath. EXAM: PORTABLE CHEST 1 VIEW COMPARISON:  01/25/2021.  Cardiac CT 06/11/2020 FINDINGS: Mild streaky opacity at the left lung base.The cardiomediastinal contours are normal. Similar hilar prominence to prior exam. Aortic atherosclerosis. The lungs are hyperinflated with paucity of upper lung markings typical of emphysema.  Pulmonary vasculature is normal. No pleural effusion or pneumothorax. No acute osseous abnormalities are seen. IMPRESSION: 1. Mild streaky opacity at the left lung base may be atelectasis or scarring. Pneumonia could hav

## 2021-09-17 DIAGNOSIS — J189 Pneumonia, unspecified organism: Secondary | ICD-10-CM | POA: Diagnosis not present

## 2021-09-17 DIAGNOSIS — A419 Sepsis, unspecified organism: Secondary | ICD-10-CM | POA: Diagnosis not present

## 2021-09-17 DIAGNOSIS — J9602 Acute respiratory failure with hypercapnia: Secondary | ICD-10-CM | POA: Diagnosis not present

## 2021-09-17 LAB — GLUCOSE, CAPILLARY: Glucose-Capillary: 172 mg/dL — ABNORMAL HIGH (ref 70–99)

## 2021-09-17 LAB — COMPREHENSIVE METABOLIC PANEL
ALT: 18 U/L (ref 0–44)
AST: 18 U/L (ref 15–41)
Albumin: 3.4 g/dL — ABNORMAL LOW (ref 3.5–5.0)
Alkaline Phosphatase: 50 U/L (ref 38–126)
Anion gap: 7 (ref 5–15)
BUN: 16 mg/dL (ref 8–23)
CO2: 26 mmol/L (ref 22–32)
Calcium: 8.2 mg/dL — ABNORMAL LOW (ref 8.9–10.3)
Chloride: 104 mmol/L (ref 98–111)
Creatinine, Ser: 0.71 mg/dL (ref 0.61–1.24)
GFR, Estimated: >60 mL/min (ref >60)
Glucose, Bld: 158 mg/dL — ABNORMAL HIGH (ref 70–99)
Potassium: 3.6 mmol/L (ref 3.5–5.1)
Sodium: 137 mmol/L (ref 135–145)
Total Bilirubin: 0.5 mg/dL (ref 0.3–1.2)
Total Protein: 6.2 g/dL — ABNORMAL LOW (ref 6.5–8.1)

## 2021-09-17 LAB — CBC
HCT: 46.1 % (ref 39.0–52.0)
Hemoglobin: 15.3 g/dL (ref 13.0–17.0)
MCH: 32.9 pg (ref 26.0–34.0)
MCHC: 33.2 g/dL (ref 30.0–36.0)
MCV: 99.1 fL (ref 80.0–100.0)
Platelets: 196 10*3/uL (ref 150–400)
RBC: 4.65 MIL/uL (ref 4.22–5.81)
RDW: 11.9 % (ref 11.5–15.5)
WBC: 15.2 10*3/uL — ABNORMAL HIGH (ref 4.0–10.5)
nRBC: 0 % (ref 0.0–0.2)

## 2021-09-17 MED ORDER — PREDNISONE 10 MG PO TABS: ORAL_TABLET | ORAL | 0 refills | Status: DC

## 2021-09-17 MED ORDER — AMOXICILLIN-POT CLAVULANATE 875-125 MG PO TABS: 1.0000 | ORAL_TABLET | Freq: Two times a day (BID) | ORAL | 0 refills | Status: AC

## 2021-09-17 NOTE — Plan of Care (Signed)

## 2021-09-17 NOTE — Discharge Summary (Addendum)
?Physician Discharge Summary ?  ?Patient: George Collins MRN: [id number] DOB: 06-02-1945  ?Admit date:     09/15/2021  ?Discharge date: 09/17/21  ?Discharge Physician: Meredeth Ide  ? ?PCP: Shon Hale, MD  ? ?Recommendations at discharge:  ? ?Follow-up PCP in 1 week ? ?Discharge Diagnoses: ?Principal Problem: ?  CAP (community acquired pneumonia) ? ?Resolved Problems: ?  * No resolved hospital problems. * ? ?Hospital Course: ?76 year old male with medical history of hyperlipidemia, diabetes mellitus type 2, COPD came to ED for evaluation of shortness of breath, generalized weakness and hypoglycemia.  He was found to be in COPD exacerbation started on DuoNeb, Solu-Medrol lactic acid was elevated 5.3> 3.7.  Blood cultures were drawn.  WBC elevated at 23.5.  Influenza and COVID-19 virus PCR are negative. ?Chest x-ray showed mild streaky opacity at left lung base which may be atelectasis or scarring.  Pneumonia could have similar appearance in the appropriate clinical setting. ? ?Assessment and Plan: ? ?Severe sepsis due to community-acquired pneumonia ?-Patient presented with severe sepsis with hypothermia ?-Labs showed leukocytosis and lactic acidosis ?-Lactic acid improved to 1.7 ?-Started on ceftriaxone anthramycin ?-Patient is clinically improved. ?-We will discharge on Augmentin 1 tablet p.o. twice daily for 7 days ?-WBC is up to 15,000, likely from Solu-Medrol. ?-He is afebrile.  Blood cultures negative to date. ?- ?  ?COPD exacerbation ?-Was placed on BiPAP in the ED due to mild hypercapnia ?-Repeat ABG showed normal PaCO2 ?-Continue albuterol inhaler as needed ?-Continue Trelegy Ellipta ?-We will discharge on prednisone taper ?  ?Diabetes mellitus type 2 ?-Continue metformin ?  ?Hypothermia ?-Resolved, likely from severe sepsis ?-TSH normal ? ?  ? ? ?Consultants:  ?Procedures performed:   ?Disposition: Home ?Diet recommendation:  ?Discharge Diet Orders (From admission, onward)  ? ?  Start     Ordered   ? 09/17/21 0000  Diet - low sodium heart healthy       ? 09/17/21 1307  ? ?  ?  ? ?  ? ?Regular diet ?DISCHARGE MEDICATION: ?Allergies as of 09/17/2021   ?No Known Allergies ?  ? ?  ?Medication List  ?  ? ?TAKE these medications   ? ?acetaminophen 500 MG tablet ?Commonly known as: TYLENOL ?Take 500 mg by mouth every 6 (six) hours as needed for moderate pain or headache. ?  ?albuterol 108 (90 Base) MCG/ACT inhaler ?Commonly known as: VENTOLIN HFA ?Inhale 2 puffs into the lungs every 6 (six) hours as needed for wheezing or shortness of breath. ?  ?amoxicillin-clavulanate 875-125 MG tablet ?Commonly known as: Augmentin ?Take 1 tablet by mouth 2 (two) times daily for 7 days. ?  ?Aspirin Low Dose 81 MG EC tablet ?Generic drug: aspirin ?Take 1 tablet (81 mg total) by mouth daily. ?  ?atorvastatin 10 MG tablet ?Commonly known as: LIPITOR ?Take 10 mg by mouth at bedtime. ?  ?MELATONIN PO ?Take 1 tablet by mouth at bedtime. ?  ?metFORMIN 1000 MG tablet ?Commonly known as: GLUCOPHAGE ?Take 0.5 tablets (500 mg total) by mouth 2 (two) times daily. ?What changed:  ?how much to take ?when to take this ?  ?mirtazapine 15 MG tablet ?Commonly known as: REMERON ?Take 15 mg by mouth at bedtime. ?  ?predniSONE 10 MG tablet ?Commonly known as: DELTASONE ?Prednisone 40 mg po daily x 1 day then Prednisone 30 mg po daily x 1 day then Prednisone 20 mg po daily x 1 day then Prednisone 10 mg daily x 1 day then stop... ?  ?  Trelegy Ellipta 200-62.5-25 MCG/ACT Aepb ?Generic drug: Fluticasone-Umeclidin-Vilant ?Inhale 1 puff into the lungs daily. ?  ? ?  ? ? Follow-up Information   ? ? Shon Hale, MD Follow up in 1 week(s).   ?Specialty: Family Medicine ?Contact information: ?[address] ?[place] [address] ?[phone] ? ? ?  ?  ? ? Jake Bathe, MD .   ?Specialty: Cardiology ?Contact information: ?[address] ?[address] ?[place] [address] ?[phone] ? ? ?  ?  ? ?  ?  ? ?  ? ?Discharge Exam: ?Filed Weights   ? 09/15/21 2133 09/16/21 1201  ?Weight: 70.3 kg 68 kg  ? ?General-appears in no acute distress ?Heart-S1-S2, regular, no murmur auscultated ?Lungs-clear to auscultation bilaterally, no wheezing or crackles auscultated ?Abdomen-soft, nontender, no organomegaly ?Extremities-no edema in the lower extremities ?Neuro-alert, oriented x3, no focal deficit noted ? ?Condition at discharge: good ? ?The results of significant diagnostics from this hospitalization (including imaging, microbiology, ancillary and laboratory) are listed below for reference.  ? ?Imaging Studies: ?DG Chest Port 1 View ? ?Result Date: 09/15/2021 ?CLINICAL DATA:  Shortness of breath. EXAM: PORTABLE CHEST 1 VIEW COMPARISON:  01/25/2021.  Cardiac CT 06/11/2020 FINDINGS: Mild streaky opacity at the left lung base.The cardiomediastinal contours are normal. Similar hilar prominence to prior exam. Aortic atherosclerosis. The lungs are hyperinflated with paucity of upper lung markings typical of emphysema. Pulmonary vasculature is normal. No pleural effusion or pneumothorax. No acute osseous abnormalities are seen. IMPRESSION: 1. Mild streaky opacity at the left lung base may be atelectasis or scarring. Pneumonia could have a similar appearance in the appropriate clinical setting. 2. Emphysema. Electronically Signed   By: Narda Rutherford M.D.   On: 09/15/2021 23:34   ? ?Microbiology: ?Results for orders placed or performed during the hospital encounter of 09/15/21  ?Culture, blood (Routine x 2)     Status: None (Preliminary result)  ? Collection Time: 09/15/21 10:10 PM  ? Specimen: BLOOD LEFT WRIST  ?Result Value Ref Range Status  ? Specimen Description   Final  ?  BLOOD LEFT WRIST ?Performed at [facility], [address] [place], [place] [address] ?  ? Special Requests   Final  ?  BOTTLES DRAWN AEROBIC AND ANAEROBIC Blood Culture adequate volume ?Performed at [facility], [address], [place], [place] [address] ?  ? Culture  PENDING  Incomplete  ? Report Status PENDING  Incomplete  ?Culture, blood (Routine x 2)     Status: None (Preliminary result)  ? Collection Time: 09/15/21 10:34 PM  ? Specimen: BLOOD RIGHT HAND  ?Result Value Ref Range Status  ? Specimen Description   Final  ?  BLOOD RIGHT HAND ?Performed at [facility], [address], [place], [place] [address] ?  ? Special Requests   Final  ?  BOTTLES DRAWN AEROBIC AND ANAEROBIC Blood Culture adequate volume ?Performed at [facility], [address], [place], [place] [address] ?  ? Culture PENDING  Incomplete  ? Report Status PENDING  Incomplete  ?Resp Panel by RT-PCR (Flu A&B, Covid) Nasopharyngeal Swab     Status: None  ? Collection Time: 09/15/21 11:30 PM  ? Specimen: Nasopharyngeal Swab; Nasopharyngeal(NP) swabs in vial transport medium  ?Result Value Ref Range Status  ? SARS Coronavirus 2 by RT PCR NEGATIVE NEGATIVE Final  ?  Comment: (NOTE) ?SARS-CoV-2 target nucleic acids are NOT DETECTED. ? ?The SARS-CoV-2 RNA is generally detectable in upper respiratory ?  specimens during the acute phase of infection. The lowest ?concentration of SARS-CoV-2 viral copies this assay can detect is ?138 copies/mL. A negative result does not preclude SARS-Cov-2 ?infection and should not be used as the sole basis for treatment or ?other patient management decisions. A negative result may occur with  ?improper specimen collection/handling, submission of specimen other ?than nasopharyngeal swab, presence of viral mutation(s) within the ?areas targeted by this assay, and inadequate number of viral ?copies(<138 copies/mL). A negative result must be combined with ?clinical observations, patient history, and epidemiological ?information. The expected result is Negative. ? ?Fact Sheet for Patients:  ?BloggerCourse.comhttps://www.fda.gov/media/152166/download ? ?Fact Sheet for Healthcare Providers:  ?SeriousBroker.ithttps://www.fda.gov/media/152162/download ? ?This test is no t yet approved or cleared by the  [place] FDA and  ?has been authorized for detection and/or diagnosis of SARS-CoV-2 by ?FDA under an Emergency Use Authorization (EUA). This EUA will remain  ?in effect (meaning this test can

## 2021-09-17 NOTE — Progress Notes (Signed)
Patient will be discharging home with family this afternoon. Belongings were returned. Education on medication will be provided.  ?

## 2021-09-19 LAB — GLUCOSE, CAPILLARY
Glucose-Capillary: 145 mg/dL — ABNORMAL HIGH (ref 70–99)
Glucose-Capillary: 141 mg/dL — ABNORMAL HIGH (ref 70–99)

## 2021-09-21 LAB — CULTURE, BLOOD (ROUTINE X 2)
Culture: NO GROWTH
Culture: NO GROWTH
Special Requests: ADEQUATE
Special Requests: ADEQUATE

## 2021-09-30 DIAGNOSIS — J441 Chronic obstructive pulmonary disease with (acute) exacerbation: Secondary | ICD-10-CM | POA: Diagnosis not present

## 2021-09-30 DIAGNOSIS — J9600 Acute respiratory failure, unspecified whether with hypoxia or hypercapnia: Secondary | ICD-10-CM | POA: Diagnosis not present

## 2021-10-07 ENCOUNTER — Institutional Professional Consult (permissible substitution): Payer: No Typology Code available for payment source | Admitting: Pulmonary Disease

## 2021-10-31 DIAGNOSIS — J9600 Acute respiratory failure, unspecified whether with hypoxia or hypercapnia: Secondary | ICD-10-CM | POA: Diagnosis not present

## 2021-10-31 DIAGNOSIS — J441 Chronic obstructive pulmonary disease with (acute) exacerbation: Secondary | ICD-10-CM | POA: Diagnosis not present

## 2021-10-31 NOTE — Progress Notes (Signed)
Synopsis: Referred for COPD by Shon Hale, *  Subjective:   PATIENT ID: George Collins GENDER: male DOB: 09-14-1945, MRN: [id number]  Chief Complaint  Patient presents with   Follow-up    PFT's done today. Breathing is overall doing well.    75yM with history of DM2, HTN, COPD, smoking quit 01/25/21 20-25 py  Recent admission through 01/28/21 for AECOPD treated with doxy and steroids. This is the only time he's ever received a course of prednisone. He does think it was helpful for dyspnea.  He has no significant cough typically - it has only worsened recently now that he's stopped smoking. He has DOE to 25 feet. Worsened over last year or 2  He has no family history of lung disease.  He worked at [employer], makes [employer]. Doesn't wear a mask at work. His breathing is a bit worse at work. No pets. Has never lived outside of [place]. 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 Some agent orange exposure when in [employer]. He goes to [place].   Interval HPI: Started on high dose trelegy last visit. He is back on wixela but he's also using symbicort, spiriva.   Admission 4/30 for AECOPD given BiPAP, CAP  PFTs today with severe obstruction, moderate-severely reduced diffusing capacity.  Not much of a cough currently.  Otherwise pertinent review of systems is negative.  Past Medical History:  Diagnosis Date   Hyperlipidemia 01/26/2021   Personal history of rectal adenoma 08/09/2009   Type 2 diabetes mellitus (HCC) 01/26/2021     Family History  Problem Relation Age of Onset   Hypertension Other      Past Surgical History:  Procedure Laterality Date   BACK SURGERY     NECK SURGERY     RIGHT/LEFT HEART CATH AND CORONARY ANGIOGRAPHY N/A 06/19/2020   Procedure: RIGHT/LEFT HEART CATH AND CORONARY ANGIOGRAPHY;  Surgeon: Corky Crafts, MD;  Location: [facility];  Service: Cardiovascular;  Laterality: N/A;    Social History    Socioeconomic History   Marital status: Single    Spouse name: Not on file   Number of children: Not on file   Years of education: Not on file   Highest education level: Not on file  Occupational History   Not on file  Tobacco Use   Smoking status: Former    Packs/day: 0.50    Years: 30.00    Total pack years: 15.00    Types: Cigarettes    Quit date: 01/25/2021    Years since quitting: 0.7   Smokeless tobacco: Never  Vaping Use   Vaping Use: Never used  Substance and Sexual Activity   Alcohol use: Yes    Comment: occ   Drug use: No   Sexual activity: Never  Other Topics Concern   Not on file  Social History Narrative   Not on file   Social Determinants of Health   Financial Resource Strain: Not on file  Food Insecurity: Not on file  Transportation Needs: Not on file  Physical Activity: Not on file  Stress: Not on file  Social Connections: Not on file  Intimate Partner Violence: Not on file     No Known Allergies   Outpatient Medications Prior to Visit  Medication Sig Dispense Refill   acetaminophen (TYLENOL) 500 MG tablet Take 500 mg by mouth every 6 (six) hours as needed for moderate pain or headache.     albuterol (VENTOLIN HFA)  108 (90 Base) MCG/ACT inhaler Inhale 2 puffs into the lungs every 6 (six) hours as needed for wheezing or shortness of breath.     aspirin 81 MG EC tablet Take 1 tablet (81 mg total) by mouth daily. 30 tablet 0   atorvastatin (LIPITOR) 10 MG tablet Take 10 mg by mouth at bedtime.     Fluticasone-Umeclidin-Vilant (TRELEGY ELLIPTA) 200-62.5-25 MCG/INH AEPB Inhale 1 puff into the lungs daily. 1 each 11   MELATONIN PO Take 1 tablet by mouth at bedtime.     metFORMIN (GLUCOPHAGE) 1000 MG tablet Take 0.5 tablets (500 mg total) by mouth 2 (two) times daily. (Patient taking differently: Take 1,000 mg by mouth 2 (two) times daily with a meal.)     mirtazapine (REMERON) 15 MG tablet Take 15 mg by mouth at bedtime.     predniSONE (DELTASONE) 10 MG  tablet Prednisone 40 mg po daily x 1 day then Prednisone 30 mg po daily x 1 day then Prednisone 20 mg po daily x 1 day then Prednisone 10 mg daily x 1 day then stop... 10 tablet 0   Facility-Administered Medications Prior to Visit  Medication Dose Route Frequency Provider Last Rate Last Admin   sodium chloride flush (NS) 0.9 % injection 3 mL  3 mL Intravenous Q12H Jake Bathe, MD           Objective:   Physical Exam:  General appearance: 76 y.o., male, NAD, conversant  Eyes: anicteric sclerae, moist conjunctivae; no lid-lag; PERRL, tracking appropriately HENT: NCAT; oropharynx, MMM, no mucosal ulcerations; normal hard and soft palate Neck: Trachea midline; no lymphadenopathy, no JVD Lungs: Diminished bilaterally, no crackles, no wheeze, with normal respiratory effort CV: RRR, no MRGs  Abdomen: Soft, non-tender; non-distended, BS present  Extremities: No peripheral edema, radial and DP pulses present bilaterally  Skin: Normal temperature, turgor and texture; no rash Psych: Appropriate affect Neuro: Alert and oriented to person and place, no focal deficit    Vitals:   11/01/21 1608  BP: 130/68  Pulse: 71  Temp: 97.6 F (36.4 C)  TempSrc: Oral  SpO2: 96%  Weight: 152 lb (68.9 kg)  Height: 5' 5.5" (1.664 m)    96% on RA, 88% on exertion, 96% on 2L BMI Readings from Last 3 Encounters:  11/01/21 24.91 kg/m  09/16/21 27.44 kg/m  02/26/21 24.96 kg/m   Wt Readings from Last 3 Encounters:  11/01/21 152 lb (68.9 kg)  09/16/21 150 lb (68 kg)  02/26/21 150 lb (68 kg)     CBC    Component Value Date/Time   WBC 15.2 (H) 09/17/2021 0521   RBC 4.65 09/17/2021 0521   HGB 15.3 09/17/2021 0521   HGB 16.8 06/15/2020 1040   HCT 46.1 09/17/2021 0521   HCT 50.1 06/15/2020 1040   PLT 196 09/17/2021 0521   PLT 192 06/15/2020 1040   MCV 99.1 09/17/2021 0521   MCV 97 06/15/2020 1040   MCH 32.9 09/17/2021 0521   MCHC 33.2 09/17/2021 0521   RDW 11.9 09/17/2021 0521   RDW  11.0 (L) 06/15/2020 1040   LYMPHSABS 1.8 06/04/2009 1450   MONOABS 0.9 06/04/2009 1450   EOSABS 0.2 06/04/2009 1450   BASOSABS 0.0 06/04/2009 1450    Chest Imaging: CXR 09/15/21 reviewed by me with likely atelectasis left base  CXR 01/25/21 reviewed by me remarkable for hyperinflation  CT Coronaries lung bases 06/11/20 reviewed by me remarkable for advanced centrilobular emphysema  Pulmonary Functions Testing Results:    Latest Ref  Rng & Units 11/01/2021    2:45 PM  PFT Results  FVC-Pre L 1.93  P  FVC-Predicted Pre % 63  P  FVC-Post L 1.96  P  FVC-Predicted Post % 64  P  Pre FEV1/FVC % % 48  P  Post FEV1/FCV % % 45  P  FEV1-Pre L 0.93  P  FEV1-Predicted Pre % 41  P  FEV1-Post L 0.89  P  DLCO uncorrected ml/min/mmHg 9.25  P  DLCO UNC% % 42  P  DLCO corrected ml/min/mmHg 9.08  P  DLCO COR %Predicted % 42  P  DLVA Predicted % 51  P    P Preliminary result      Echocardiogram:   TTE 06/22/20 with G1DD      Assessment & Plan:   # COPD Gold functional group D:  He [facility] and didn't like the program due to the drive and that his 'body just wasn't ready for that.'  # Smoking: # Pulmonary nodules: Lung-rads 3 dominant nodules LUL.   # Chronic hypoxic respiratory failure: Needs O2 2L with exertion and sleep.  Plan: - start breztri 2 puffs twice daily with spacer, reinforced technique. He has tried and failed trelegy, symbicort, wixela, spiriva - lung volumes next visit - declines pulmonary rehab referral - continues lung cancer screening at [facility], has one next week - ongoing smoking cessation encouraged   RTC 6 months  Omar Person, MD [facility] 11/01/2021 4:36 PM

## 2021-11-01 ENCOUNTER — Encounter: Payer: Self-pay | Admitting: Student

## 2021-11-01 ENCOUNTER — Ambulatory Visit (INDEPENDENT_AMBULATORY_CARE_PROVIDER_SITE_OTHER): Payer: No Typology Code available for payment source | Admitting: Student

## 2021-11-01 VITALS — BP 130/68 | HR 71 | Temp 97.6°F | Ht 65.5 in | Wt 152.0 lb

## 2021-11-01 DIAGNOSIS — J449 Chronic obstructive pulmonary disease, unspecified: Secondary | ICD-10-CM

## 2021-11-01 DIAGNOSIS — R918 Other nonspecific abnormal finding of lung field: Secondary | ICD-10-CM

## 2021-11-01 LAB — PULMONARY FUNCTION TEST
DL/VA % pred: 51 %
DL/VA: 2.08 ml/min/mmHg/L
DLCO cor % pred: 42 %
DLCO cor: 9.08 ml/min/mmHg
DLCO unc % pred: 42 %
DLCO unc: 9.25 ml/min/mmHg
FEF 25-75 Post: 0.35 L/sec
FEF 25-75 Pre: 0.36 L/sec
FEF2575-%Change-Post: -2 %
FEF2575-%Pred-Post: 18 %
FEF2575-%Pred-Pre: 19 %
FEV1-%Change-Post: -4 %
FEV1-%Pred-Post: 39 %
FEV1-%Pred-Pre: 41 %
FEV1-Post: 0.89 L
FEV1-Pre: 0.93 L
FEV1FVC-%Change-Post: -5 %
FEV1FVC-%Pred-Pre: 63 %
FEV6-%Change-Post: 1 %
FEV6-%Pred-Post: 64 %
FEV6-%Pred-Pre: 64 %
FEV6-Post: 1.86 L
FEV6-Pre: 1.83 L
FEV6FVC-%Change-Post: 0 %
FEV6FVC-%Pred-Post: 100 %
FEV6FVC-%Pred-Pre: 100 %
FVC-%Change-Post: 1 %
FVC-%Pred-Post: 64 %
FVC-%Pred-Pre: 63 %
FVC-Post: 1.96 L
FVC-Pre: 1.93 L
Post FEV1/FVC ratio: 45 %
Post FEV6/FVC ratio: 95 %
Pre FEV1/FVC ratio: 48 %
Pre FEV6/FVC Ratio: 95 %

## 2021-11-01 MED ORDER — AEROCHAMBER MV MISC: 0 refills | Status: AC

## 2021-11-01 MED ORDER — BREZTRI AEROSPHERE 160-9-4.8 MCG/ACT IN AERO: 2.0000 | INHALATION_SPRAY | Freq: Two times a day (BID) | RESPIRATORY_TRACT | 0 refills | Status: DC

## 2021-11-01 MED ORDER — BREZTRI AEROSPHERE 160-9-4.8 MCG/ACT IN AERO: 2.0000 | INHALATION_SPRAY | Freq: Two times a day (BID) | RESPIRATORY_TRACT | 11 refills | Status: AC

## 2021-11-01 NOTE — Patient Instructions (Addendum)
Attempted Full PFT Today. Pre/Post Spirometry and DLCO Performed Today.  

## 2021-11-01 NOTE — Progress Notes (Addendum)
Attempted Full PFT Today. Pre/Post Spirometry and DLCO Performed Today.  

## 2021-11-01 NOTE — Patient Instructions (Signed)
-   Breztri 2 puffs twice daily with spacer, rinse mouth after sp. Stop symbicort, stop advair, stop wixela. - lung volumes before next visit - reach out if any concerning findings on CT chest next week - return in 6 months

## 2021-11-30 DIAGNOSIS — J441 Chronic obstructive pulmonary disease with (acute) exacerbation: Secondary | ICD-10-CM | POA: Diagnosis not present

## 2021-11-30 DIAGNOSIS — J9600 Acute respiratory failure, unspecified whether with hypoxia or hypercapnia: Secondary | ICD-10-CM | POA: Diagnosis not present

## 2021-12-05 DIAGNOSIS — E113399 Type 2 diabetes mellitus with moderate nonproliferative diabetic retinopathy without macular edema, unspecified eye: Secondary | ICD-10-CM | POA: Diagnosis not present

## 2021-12-05 DIAGNOSIS — J449 Chronic obstructive pulmonary disease, unspecified: Secondary | ICD-10-CM | POA: Diagnosis not present

## 2021-12-13 DIAGNOSIS — E119 Type 2 diabetes mellitus without complications: Secondary | ICD-10-CM | POA: Diagnosis not present

## 2021-12-13 DIAGNOSIS — I10 Essential (primary) hypertension: Secondary | ICD-10-CM | POA: Diagnosis not present

## 2021-12-13 DIAGNOSIS — E7800 Pure hypercholesterolemia, unspecified: Secondary | ICD-10-CM | POA: Diagnosis not present

## 2021-12-13 DIAGNOSIS — J449 Chronic obstructive pulmonary disease, unspecified: Secondary | ICD-10-CM | POA: Diagnosis not present

## 2021-12-31 DIAGNOSIS — J441 Chronic obstructive pulmonary disease with (acute) exacerbation: Secondary | ICD-10-CM | POA: Diagnosis not present

## 2021-12-31 DIAGNOSIS — J9600 Acute respiratory failure, unspecified whether with hypoxia or hypercapnia: Secondary | ICD-10-CM | POA: Diagnosis not present

## 2022-01-21 DIAGNOSIS — I10 Essential (primary) hypertension: Secondary | ICD-10-CM | POA: Diagnosis not present

## 2022-01-21 DIAGNOSIS — E119 Type 2 diabetes mellitus without complications: Secondary | ICD-10-CM | POA: Diagnosis not present

## 2022-01-21 DIAGNOSIS — E7800 Pure hypercholesterolemia, unspecified: Secondary | ICD-10-CM | POA: Diagnosis not present

## 2022-01-21 DIAGNOSIS — J449 Chronic obstructive pulmonary disease, unspecified: Secondary | ICD-10-CM | POA: Diagnosis not present

## 2022-01-23 ENCOUNTER — Telehealth: Payer: Self-pay | Admitting: Student

## 2022-01-23 DIAGNOSIS — J449 Chronic obstructive pulmonary disease, unspecified: Secondary | ICD-10-CM

## 2022-01-24 NOTE — Telephone Encounter (Signed)
Spoke to [place] with [facility]. She is calling for update on oxygen order. She stated that a call was made to our office in July and was advised that an order would be placed to [facility].  I'm unable to locate this documentation within patient's chart.  According to last office note, patient is prescribed 2L with exertion and at night. It does not appear that our office originally prescribed oxygen for patient, however It does appear that patient was walked and qualified for 2L at last OV per [facility] note.  Dr. Thora Lance, please advise if okay to order? Thanks

## 2022-01-27 NOTE — Telephone Encounter (Signed)
I don't think we walked him with POC. If he wants POC rather than tanks we'll need to bring him in for POC walk.   Thanks!

## 2022-01-29 NOTE — Telephone Encounter (Signed)
Fine with me to ask if [facility] will do the evaluation

## 2022-01-29 NOTE — Telephone Encounter (Signed)
In the O2 order, there is a part of the order that specifies to have DME to do a best fit evaluation for either a POC or other best fit.  Are you okay with [place] going ahead and placing the O2 order with this being in the order and then [facility] can then do the evaluation.

## 2022-01-31 DIAGNOSIS — J9600 Acute respiratory failure, unspecified whether with hypoxia or hypercapnia: Secondary | ICD-10-CM | POA: Diagnosis not present

## 2022-01-31 DIAGNOSIS — J441 Chronic obstructive pulmonary disease with (acute) exacerbation: Secondary | ICD-10-CM | POA: Diagnosis not present

## 2022-01-31 NOTE — Telephone Encounter (Signed)
Order placed. Nothing further needed. 

## 2022-03-02 DIAGNOSIS — J9600 Acute respiratory failure, unspecified whether with hypoxia or hypercapnia: Secondary | ICD-10-CM | POA: Diagnosis not present

## 2022-03-02 DIAGNOSIS — J441 Chronic obstructive pulmonary disease with (acute) exacerbation: Secondary | ICD-10-CM | POA: Diagnosis not present

## 2022-03-05 DIAGNOSIS — E11319 Type 2 diabetes mellitus with unspecified diabetic retinopathy without macular edema: Secondary | ICD-10-CM | POA: Diagnosis not present

## 2022-03-05 DIAGNOSIS — E113399 Type 2 diabetes mellitus with moderate nonproliferative diabetic retinopathy without macular edema, unspecified eye: Secondary | ICD-10-CM | POA: Diagnosis not present

## 2022-04-02 DIAGNOSIS — J441 Chronic obstructive pulmonary disease with (acute) exacerbation: Secondary | ICD-10-CM | POA: Diagnosis not present

## 2022-04-02 DIAGNOSIS — J9600 Acute respiratory failure, unspecified whether with hypoxia or hypercapnia: Secondary | ICD-10-CM | POA: Diagnosis not present

## 2022-04-07 DIAGNOSIS — J019 Acute sinusitis, unspecified: Secondary | ICD-10-CM | POA: Diagnosis not present

## 2022-04-07 DIAGNOSIS — J209 Acute bronchitis, unspecified: Secondary | ICD-10-CM | POA: Diagnosis not present

## 2022-04-16 ENCOUNTER — Telehealth: Payer: Self-pay | Admitting: Student

## 2022-04-16 NOTE — Telephone Encounter (Signed)
Attempted to call Jasmine with [employer] to further discuss this but unable to reach. Left message for her to return call.

## 2022-04-17 NOTE — Telephone Encounter (Signed)
Message sent to [facility] @ [facility]

## 2022-04-17 NOTE — Telephone Encounter (Signed)
PCC's can you please assist in finding out what we need to do to get [facility] to fulfill his POC request. There is order in from Sept 2023. Thanks.

## 2022-04-18 DIAGNOSIS — J441 Chronic obstructive pulmonary disease with (acute) exacerbation: Secondary | ICD-10-CM | POA: Diagnosis not present

## 2022-04-18 DIAGNOSIS — J9600 Acute respiratory failure, unspecified whether with hypoxia or hypercapnia: Secondary | ICD-10-CM | POA: Diagnosis not present

## 2022-04-18 NOTE — Telephone Encounter (Signed)
ATC LVMTCB x 1. We need to out if POC evaluation was ever completed and if not then why.

## 2022-04-18 NOTE — Telephone Encounter (Signed)
Jasmine spoke to [place] and stated there was not a order for a POC just to evaluate for one

## 2022-04-18 NOTE — Telephone Encounter (Signed)
Spoke with Jasmine who states POC evaluation was completed. After reviewing chart I did not see any documentation of evaluation and hence no order for POC has been placed. Jasmine stated she was going to e-mail respiratory team and call back with update. Jasmine was given direct number to reach [employer]

## 2022-04-22 NOTE — Telephone Encounter (Signed)
I have sent community msg to [facility] to check on this. Will update note once I hear back.

## 2022-04-24 NOTE — Telephone Encounter (Signed)
George Collins  Christen Butter, [place]; George Collins Hi! Notes in his account advise that a POC eval was done on 9/22 & a POC was delivered to patient on 04/18/2022.

## 2022-04-24 NOTE — Telephone Encounter (Signed)
George Collins, please advise if you have received any update on this.

## 2022-05-02 DIAGNOSIS — J441 Chronic obstructive pulmonary disease with (acute) exacerbation: Secondary | ICD-10-CM | POA: Diagnosis not present

## 2022-05-02 DIAGNOSIS — J9600 Acute respiratory failure, unspecified whether with hypoxia or hypercapnia: Secondary | ICD-10-CM | POA: Diagnosis not present

## 2022-05-14 NOTE — Progress Notes (Unsigned)
Synopsis: Referred for COPD by Shon Hale, *  Subjective:   PATIENT ID: George Collins GENDER: male DOB: 04-11-1946, MRN: [id number]  No chief complaint on file.  75yM with history of DM2, HTN, COPD, smoking quit 01/25/21 20-25 py  Recent admission through 01/28/21 for AECOPD treated with doxy and steroids. This is the only time he's ever received a course of prednisone. He does think it was helpful for dyspnea.  He has no significant cough typically - it has only worsened recently now that he's stopped smoking. He has DOE to 25 feet. Worsened over last year or 2  He has no family history of lung disease.  He worked at [employer], makes [employer]. Doesn't wear a mask at work. His breathing is a bit worse at work. No pets. Has never lived outside of [place]. 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 Some agent orange exposure when in [employer]. He goes to [place].   Interval HPI: Started on high dose trelegy last visit. He is back on wixela but he's also using symbicort, spiriva.   Admission 4/30 for AECOPD given BiPAP, CAP  PFTs today with severe obstruction, moderate-severely reduced diffusing capacity.  Not much of a cough currently. -------------------------------------------- POC  Lung volumes  Otherwise pertinent review of systems is negative.  Past Medical History:  Diagnosis Date   Hyperlipidemia 01/26/2021   Personal history of rectal adenoma 08/09/2009   Type 2 diabetes mellitus (HCC) 01/26/2021     Family History  Problem Relation Age of Onset   Hypertension Other      Past Surgical History:  Procedure Laterality Date   BACK SURGERY     NECK SURGERY     RIGHT/LEFT HEART CATH AND CORONARY ANGIOGRAPHY N/A 06/19/2020   Procedure: RIGHT/LEFT HEART CATH AND CORONARY ANGIOGRAPHY;  Surgeon: Corky Crafts, MD;  Location: [facility] INVASIVE [facility];  Service: Cardiovascular;  Laterality: N/A;    Social History   Socioeconomic History    Marital status: Single    Spouse name: Not on file   Number of children: Not on file   Years of education: Not on file   Highest education level: Not on file  Occupational History   Not on file  Tobacco Use   Smoking status: Former    Packs/day: 0.50    Years: 30.00    Total pack years: 15.00    Types: Cigarettes    Quit date: 01/25/2021    Years since quitting: 1.2   Smokeless tobacco: Never  Vaping Use   Vaping Use: Never used  Substance and Sexual Activity   Alcohol use: Yes    Comment: occ   Drug use: No   Sexual activity: Never  Other Topics Concern   Not on file  Social History Narrative   Not on file   Social Determinants of Health   Financial Resource Strain: Not on file  Food Insecurity: Not on file  Transportation Needs: Not on file  Physical Activity: Not on file  Stress: Not on file  Social Connections: Not on file  Intimate Partner Violence: Not on file     No Known Allergies   Outpatient Medications Prior to Visit  Medication Sig Dispense Refill   acetaminophen (TYLENOL) 500 MG tablet Take 500 mg by mouth every 6 (six) hours as needed for moderate pain or headache.     albuterol (VENTOLIN HFA) 108 (90 Base) MCG/ACT inhaler Inhale 2 puffs into the lungs every  6 (six) hours as needed for wheezing or shortness of breath.     aspirin 81 MG EC tablet Take 1 tablet (81 mg total) by mouth daily. 30 tablet 0   atorvastatin (LIPITOR) 10 MG tablet Take 10 mg by mouth at bedtime.     Budeson-Glycopyrrol-Formoterol (BREZTRI AEROSPHERE) 160-9-4.8 MCG/ACT AERO Inhale 2 puffs into the lungs in the morning and at bedtime. 10.7 g 11   Budeson-Glycopyrrol-Formoterol (BREZTRI AEROSPHERE) 160-9-4.8 MCG/ACT AERO Inhale 2 puffs into the lungs in the morning and at bedtime. 5.9 g 0   MELATONIN PO Take 1 tablet by mouth at bedtime.     metFORMIN (GLUCOPHAGE) 1000 MG tablet Take 0.5 tablets (500 mg total) by mouth 2 (two) times daily. (Patient taking differently: Take 1,000 mg by  mouth 2 (two) times daily with a meal.)     mirtazapine (REMERON) 15 MG tablet Take 15 mg by mouth at bedtime.     Spacer/Aero-Holding Chambers (AEROCHAMBER MV) inhaler Use as instructed 1 each 0   Facility-Administered Medications Prior to Visit  Medication Dose Route Frequency Provider Last Rate Last Admin   sodium chloride flush (NS) 0.9 % injection 3 mL  3 mL Intravenous Q12H Jake Bathe, MD           Objective:   Physical Exam:  General appearance: 76 y.o., male, NAD, conversant  Eyes: anicteric sclerae, moist conjunctivae; no lid-lag; PERRL, tracking appropriately HENT: NCAT; oropharynx, MMM, no mucosal ulcerations; normal hard and soft palate Neck: Trachea midline; no lymphadenopathy, no JVD Lungs: Diminished bilaterally, no crackles, no wheeze, with normal respiratory effort CV: RRR, no MRGs  Abdomen: Soft, non-tender; non-distended, BS present  Extremities: No peripheral edema, radial and DP pulses present bilaterally  Skin: Normal temperature, turgor and texture; no rash Psych: Appropriate affect Neuro: Alert and oriented to person and place, no focal deficit    There were no vitals filed for this visit.     on RA, 88% on exertion, 96% on 2L BMI Readings from Last 3 Encounters:  11/01/21 24.91 kg/m  09/16/21 27.44 kg/m  02/26/21 24.96 kg/m   Wt Readings from Last 3 Encounters:  11/01/21 152 lb (68.9 kg)  09/16/21 150 lb (68 kg)  02/26/21 150 lb (68 kg)     CBC    Component Value Date/Time   WBC 15.2 (H) 09/17/2021 0521   RBC 4.65 09/17/2021 0521   HGB 15.3 09/17/2021 0521   HGB 16.8 06/15/2020 1040   HCT 46.1 09/17/2021 0521   HCT 50.1 06/15/2020 1040   PLT 196 09/17/2021 0521   PLT 192 06/15/2020 1040   MCV 99.1 09/17/2021 0521   MCV 97 06/15/2020 1040   MCH 32.9 09/17/2021 0521   MCHC 33.2 09/17/2021 0521   RDW 11.9 09/17/2021 0521   RDW 11.0 (L) 06/15/2020 1040   LYMPHSABS 1.8 06/04/2009 1450   MONOABS 0.9 06/04/2009 1450   EOSABS 0.2  06/04/2009 1450   BASOSABS 0.0 06/04/2009 1450    Chest Imaging: CXR 09/15/21 reviewed by me with likely atelectasis left base  CXR 01/25/21 reviewed by me remarkable for hyperinflation  CT Coronaries lung bases 06/11/20 reviewed by me remarkable for advanced centrilobular emphysema  CT Chest 11/05/21 lung cancer screening reviewed by me with stable emphysema  Pulmonary Functions Testing Results:    Latest Ref Rng & Units 11/01/2021    2:45 PM  PFT Results  FVC-Pre L 1.93   FVC-Predicted Pre % 63   FVC-Post L 1.96   FVC-Predicted Post %  64   Pre FEV1/FVC % % 48   Post FEV1/FCV % % 45   FEV1-Pre L 0.93   FEV1-Predicted Pre % 41   FEV1-Post L 0.89   DLCO uncorrected ml/min/mmHg 9.25   DLCO UNC% % 42   DLCO corrected ml/min/mmHg 9.08   DLCO COR %Predicted % 42   DLVA Predicted % 51       Echocardiogram:   TTE 06/22/20 with G1DD      Assessment & Plan:   # COPD Gold functional group D:  He did [facility] pulmonary re[facility] few [facility] and didn't like the program due to the drive and that his 'body just wasn't ready for that.'  # Smoking: # Pulmonary nodules: Lung-rads 3 dominant nodules LUL.   # Chronic hypoxic respiratory failure: Needs O2 2L with exertion and sleep.  Plan: - start breztri 2 puffs twice daily with spacer, reinforced technique. He has tried and failed trelegy, symbicort, wixela, spiriva - lung volumes next visit - declines pulmonary rehab referral - continues lung cancer screening at [facility], has one next week - ongoing smoking cessation encouraged   RTC 6 months  Omar Person, MD [facility] 05/14/2022 5:54 PM

## 2022-05-15 ENCOUNTER — Ambulatory Visit (INDEPENDENT_AMBULATORY_CARE_PROVIDER_SITE_OTHER): Payer: Self-pay | Admitting: Student

## 2022-05-15 ENCOUNTER — Ambulatory Visit (INDEPENDENT_AMBULATORY_CARE_PROVIDER_SITE_OTHER): Payer: No Typology Code available for payment source | Admitting: Student

## 2022-05-15 ENCOUNTER — Encounter: Payer: Self-pay | Admitting: Student

## 2022-05-15 VITALS — BP 112/70 | HR 78 | Temp 98.7°F | Ht 65.5 in | Wt 149.0 lb

## 2022-05-15 DIAGNOSIS — J449 Chronic obstructive pulmonary disease, unspecified: Secondary | ICD-10-CM

## 2022-05-15 DIAGNOSIS — R918 Other nonspecific abnormal finding of lung field: Secondary | ICD-10-CM

## 2022-05-15 NOTE — Patient Instructions (Signed)
Patient not able to complete lung volumes.

## 2022-05-15 NOTE — Patient Instructions (Signed)
-   Breztri 2 puffs twice daily with spacer, rinse mouth after each use - albuterol as needed - reach out if any concerning findings on CT chest in February or if you'd like to try pulmonary rehab - return in 6 months

## 2022-05-15 NOTE — Progress Notes (Signed)
Patient still not able to complete lung volumes. Attempt made but patient panicked after having to wear nose clilp.

## 2022-05-16 DIAGNOSIS — U071 COVID-19: Secondary | ICD-10-CM | POA: Diagnosis not present

## 2022-05-18 IMAGING — CT CT HEART MORP W/ CTA COR W/ SCORE W/ CA W/CM &/OR W/O CM
4 of 7 series · 8 of 20 positions shown, 9 images · IV contrast (APPLIED)
Comparison: Chest radiograph 08/27/2009
COMPARISON: Chest radiograph 08/27/2009

Addendum:
EXAM:
OVER-READ INTERPRETATION  CT CHEST

The following report is an over-read performed by radiologist Dr.
Raigardas Morland [REDACTED] on 06/11/2020. This over-read
does not include interpretation of cardiac or coronary anatomy or
pathology. The coronary calcium score/coronary CTA interpretation by
the cardiologist is attached.
CLINICAL DATA: 74 year old male with shortness of breath, possible
anginal equivalent.
Cardiac/Coronary  CTA
TECHNIQUE: The patient was scanned on a Phillips Force scanner.

[Series 6: best diast 72 % · axial · 0.40mm/px · z∈[+1070,+1114]mm · 2 of 331 slices shown]
[im 111/331  vessel]
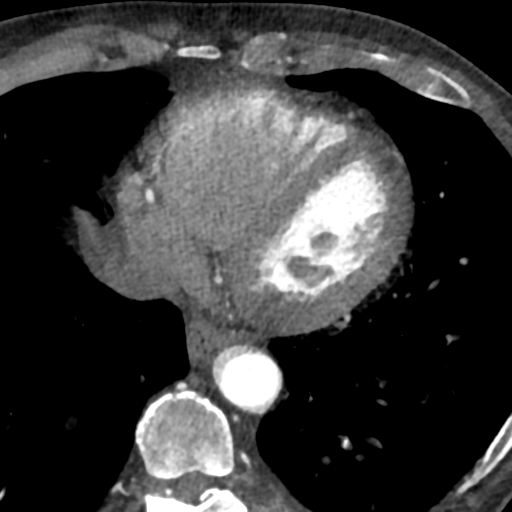
[im 221/331  vessel]
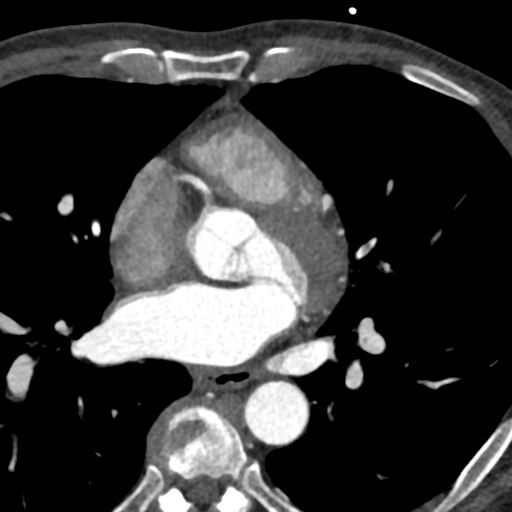

[Series 7: best syst · axial · 0.40mm/px · z∈[+1070,+1114]mm · 2 of 331 slices shown, 3 images]
[im 111/331  vessel]
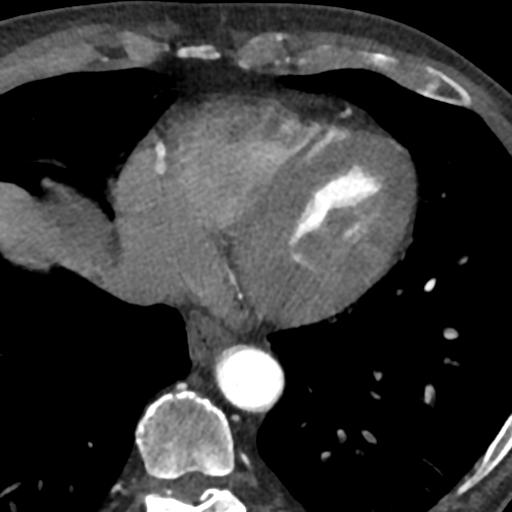
[im 111/331  lung]
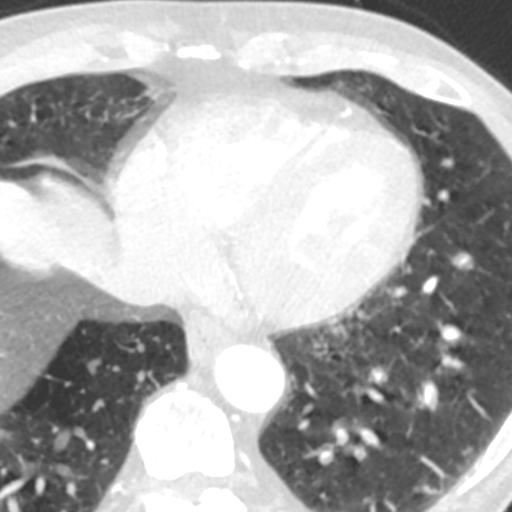
[im 221/331  vessel]
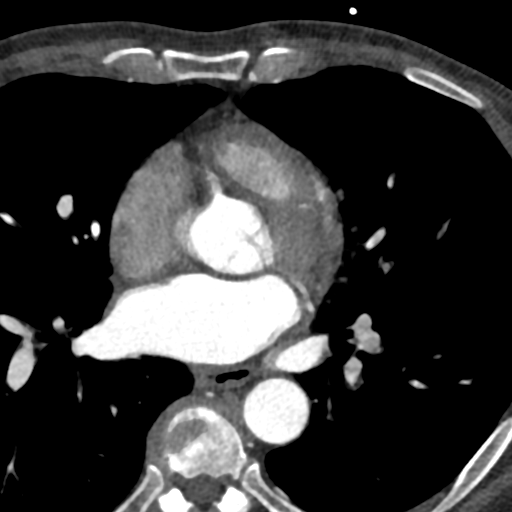

[Series 8: ts diast sharp 72 % · axial · 0.40mm/px · z∈[+1070,+1114]mm · 2 of 331 slices shown]
[im 111/331  lung]
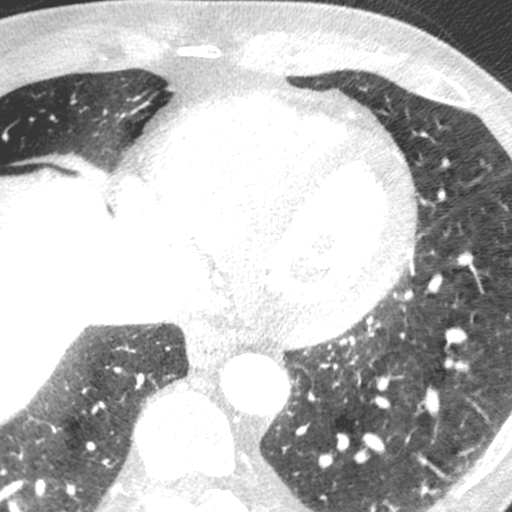
[im 221/331  lung]
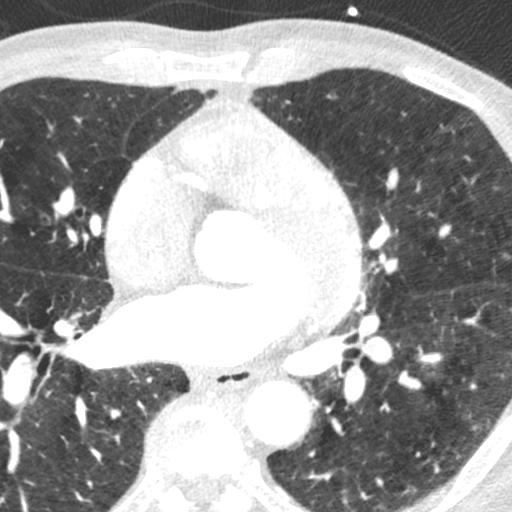

[Series 9: ts syst sharp · axial · 0.40mm/px · z∈[+1070,+1114]mm · 2 of 331 slices shown]
[im 111/331  lung]
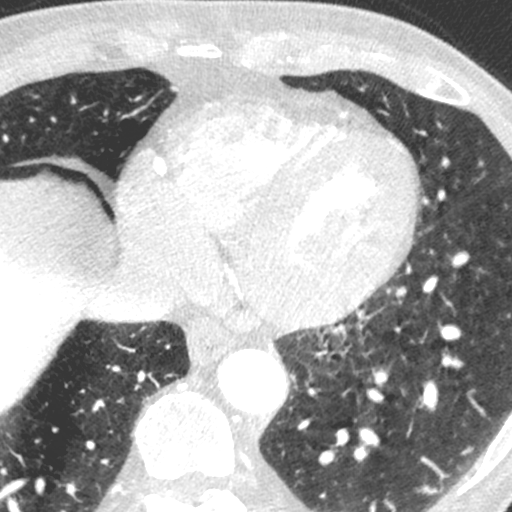
[im 221/331  lung]
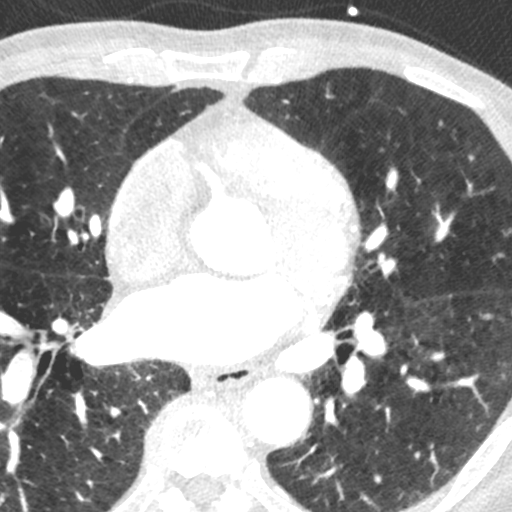

[8 of 20 positions shown; findings below may reference images not displayed]

FINDINGS: Vascular: Normal caliber of the visualized thoracic aorta. Main
pulmonary arteries are patent. Limited evaluation of the distal
pulmonary arteries due to incomplete opacification. No significant
pericardial fluid.

Mediastinum/Nodes: Visualized mediastinal and hilar structures are
unremarkable.

Lungs/Pleura: Evidence for centrilobular emphysema. Incomplete
visualization of the lungs. No significant airspace disease or
consolidation in the visualized lungs. No large pleural effusions.

Upper Abdomen: Poorly defined 6 mm hypodensity at the hepatic dome
is likely an incidental finding.

Musculoskeletal: No acute bone abnormality.
IMPRESSION: 1. No acute abnormality.
2.  Emphysema (QOAW2-VBN.3).
FINDINGS: A 120 kV prospective scan was triggered in the descending thoracic
aorta at 111 HU's. Axial non-contrast 3 mm slices were carried out
through the heart. The data set was analyzed on a dedicated work
station and scored using the Agatson method. Gantry rotation speed
was 250 msecs and collimation was .6 mm. Beta blockade and 0.8 mg of
sl NTG was given. The 3D data set was reconstructed in 5% intervals
of the 67-82 % of the R-R cycle. Diastolic phases were analyzed on a
dedicated work station using MPR, MIP and VRT modes. The patient
received 80 cc of contrast.

Aorta:  Normal size.  No calcifications.  No dissection.

Aortic Valve:  Trileaflet.  No calcifications.

Coronary Arteries:  Normal coronary origin.  Right dominance.

RCA is a large dominant artery that gives rise to PDA and PLA. There
is no proximal calcified plaque, 0-24%. There is stair step artifact
in the mid RCA as well as proximal PL branch that does not allow for
interpretation of those specific segments.

Left main is a large artery that gives rise to LAD and LCX arteries.

LAD is a large vessel that has two calcified plaques in the proximal
segment, 0-24%. There is a large stair step artifact that does not
allow for interpretation of a portion of the mid branch as well as
distal branch.

LCX is a non-dominant artery that gives rise to one medium OM1
branch. There is calcified plaque in the proximal AV groove
circumflex, 24-49% stenosis. There is a portion of the mid branch
after the first OM that does not allow for interpretation due to
stair step artifact.

There is a moderate sized ramus branch with a mid portion that does
not allow for interpretation due to stair step artifact.

Other findings:

Normal pulmonary vein drainage into the left atrium.

Normal left atrial appendage without a thrombus.

Normal size of the pulmonary artery.

Please see radiology report for non cardiac findings.
IMPRESSION: 1. Coronary calcium score of 182. This was 70 percentile for age and
sex matched control.

2. Normal coronary origin with right dominance.

3. There is non obstructive CAD (calcified plaque) in the proximal
LAD, Circumflex and RCA. There are significant portions of these mid
and distal vessels that are not interpretable due to artifact.
Consider further evaluation with cardiac catheterization if
clinically warranted.

4.  COPD noted per radiology review.

*** End of Addendum ***
EXAM:
OVER-READ INTERPRETATION  CT CHEST

The following report is an over-read performed by radiologist Dr.
Raigardas Morland [REDACTED] on 06/11/2020. This over-read
does not include interpretation of cardiac or coronary anatomy or
pathology. The coronary calcium score/coronary CTA interpretation by
the cardiologist is attached.
FINDINGS: Vascular: Normal caliber of the visualized thoracic aorta. Main
pulmonary arteries are patent. Limited evaluation of the distal
pulmonary arteries due to incomplete opacification. No significant
pericardial fluid.

Mediastinum/Nodes: Visualized mediastinal and hilar structures are
unremarkable.

Lungs/Pleura: Evidence for centrilobular emphysema. Incomplete
visualization of the lungs. No significant airspace disease or
consolidation in the visualized lungs. No large pleural effusions.

Upper Abdomen: Poorly defined 6 mm hypodensity at the hepatic dome
is likely an incidental finding.

Musculoskeletal: No acute bone abnormality.
IMPRESSION: 1. No acute abnormality.
2.  Emphysema (QOAW2-VBN.3).

## 2022-05-19 DIAGNOSIS — J441 Chronic obstructive pulmonary disease with (acute) exacerbation: Secondary | ICD-10-CM | POA: Diagnosis not present

## 2022-05-19 DIAGNOSIS — J9600 Acute respiratory failure, unspecified whether with hypoxia or hypercapnia: Secondary | ICD-10-CM | POA: Diagnosis not present

## 2022-06-02 DIAGNOSIS — J441 Chronic obstructive pulmonary disease with (acute) exacerbation: Secondary | ICD-10-CM | POA: Diagnosis not present

## 2022-06-02 DIAGNOSIS — J9600 Acute respiratory failure, unspecified whether with hypoxia or hypercapnia: Secondary | ICD-10-CM | POA: Diagnosis not present

## 2022-06-19 DIAGNOSIS — J441 Chronic obstructive pulmonary disease with (acute) exacerbation: Secondary | ICD-10-CM | POA: Diagnosis not present

## 2022-06-19 DIAGNOSIS — J9600 Acute respiratory failure, unspecified whether with hypoxia or hypercapnia: Secondary | ICD-10-CM | POA: Diagnosis not present

## 2022-07-03 DIAGNOSIS — J441 Chronic obstructive pulmonary disease with (acute) exacerbation: Secondary | ICD-10-CM | POA: Diagnosis not present

## 2022-07-03 DIAGNOSIS — J9600 Acute respiratory failure, unspecified whether with hypoxia or hypercapnia: Secondary | ICD-10-CM | POA: Diagnosis not present

## 2022-07-14 DIAGNOSIS — E7800 Pure hypercholesterolemia, unspecified: Secondary | ICD-10-CM | POA: Diagnosis not present

## 2022-07-14 DIAGNOSIS — E119 Type 2 diabetes mellitus without complications: Secondary | ICD-10-CM | POA: Diagnosis not present

## 2022-07-14 DIAGNOSIS — I10 Essential (primary) hypertension: Secondary | ICD-10-CM | POA: Diagnosis not present

## 2022-07-14 DIAGNOSIS — J449 Chronic obstructive pulmonary disease, unspecified: Secondary | ICD-10-CM | POA: Diagnosis not present

## 2022-07-18 DIAGNOSIS — J441 Chronic obstructive pulmonary disease with (acute) exacerbation: Secondary | ICD-10-CM | POA: Diagnosis not present

## 2022-07-18 DIAGNOSIS — J9600 Acute respiratory failure, unspecified whether with hypoxia or hypercapnia: Secondary | ICD-10-CM | POA: Diagnosis not present

## 2022-08-01 DIAGNOSIS — J9600 Acute respiratory failure, unspecified whether with hypoxia or hypercapnia: Secondary | ICD-10-CM | POA: Diagnosis not present

## 2022-08-01 DIAGNOSIS — J441 Chronic obstructive pulmonary disease with (acute) exacerbation: Secondary | ICD-10-CM | POA: Diagnosis not present

## 2022-08-18 DIAGNOSIS — J441 Chronic obstructive pulmonary disease with (acute) exacerbation: Secondary | ICD-10-CM | POA: Diagnosis not present

## 2022-08-18 DIAGNOSIS — J9600 Acute respiratory failure, unspecified whether with hypoxia or hypercapnia: Secondary | ICD-10-CM | POA: Diagnosis not present

## 2022-09-01 DIAGNOSIS — J441 Chronic obstructive pulmonary disease with (acute) exacerbation: Secondary | ICD-10-CM | POA: Diagnosis not present

## 2022-09-01 DIAGNOSIS — J9600 Acute respiratory failure, unspecified whether with hypoxia or hypercapnia: Secondary | ICD-10-CM | POA: Diagnosis not present

## 2022-09-09 DIAGNOSIS — Z9981 Dependence on supplemental oxygen: Secondary | ICD-10-CM | POA: Diagnosis not present

## 2022-09-09 DIAGNOSIS — Z79899 Other long term (current) drug therapy: Secondary | ICD-10-CM | POA: Diagnosis not present

## 2022-09-09 DIAGNOSIS — E119 Type 2 diabetes mellitus without complications: Secondary | ICD-10-CM | POA: Diagnosis not present

## 2022-09-09 DIAGNOSIS — I10 Essential (primary) hypertension: Secondary | ICD-10-CM | POA: Diagnosis not present

## 2022-09-09 DIAGNOSIS — Z0000 Encounter for general adult medical examination without abnormal findings: Secondary | ICD-10-CM | POA: Diagnosis not present

## 2022-09-09 DIAGNOSIS — J449 Chronic obstructive pulmonary disease, unspecified: Secondary | ICD-10-CM | POA: Diagnosis not present

## 2022-09-09 DIAGNOSIS — E113399 Type 2 diabetes mellitus with moderate nonproliferative diabetic retinopathy without macular edema, unspecified eye: Secondary | ICD-10-CM | POA: Diagnosis not present

## 2022-09-09 DIAGNOSIS — E7800 Pure hypercholesterolemia, unspecified: Secondary | ICD-10-CM | POA: Diagnosis not present

## 2022-09-09 DIAGNOSIS — J9611 Chronic respiratory failure with hypoxia: Secondary | ICD-10-CM | POA: Diagnosis not present

## 2022-09-09 DIAGNOSIS — Z9181 History of falling: Secondary | ICD-10-CM | POA: Diagnosis not present

## 2022-09-17 DIAGNOSIS — J9600 Acute respiratory failure, unspecified whether with hypoxia or hypercapnia: Secondary | ICD-10-CM | POA: Diagnosis not present

## 2022-09-17 DIAGNOSIS — J441 Chronic obstructive pulmonary disease with (acute) exacerbation: Secondary | ICD-10-CM | POA: Diagnosis not present

## 2022-10-01 DIAGNOSIS — J9600 Acute respiratory failure, unspecified whether with hypoxia or hypercapnia: Secondary | ICD-10-CM | POA: Diagnosis not present

## 2022-10-01 DIAGNOSIS — J441 Chronic obstructive pulmonary disease with (acute) exacerbation: Secondary | ICD-10-CM | POA: Diagnosis not present

## 2022-10-18 DIAGNOSIS — J441 Chronic obstructive pulmonary disease with (acute) exacerbation: Secondary | ICD-10-CM | POA: Diagnosis not present

## 2022-10-18 DIAGNOSIS — J9600 Acute respiratory failure, unspecified whether with hypoxia or hypercapnia: Secondary | ICD-10-CM | POA: Diagnosis not present

## 2022-11-01 DIAGNOSIS — J9600 Acute respiratory failure, unspecified whether with hypoxia or hypercapnia: Secondary | ICD-10-CM | POA: Diagnosis not present

## 2022-11-01 DIAGNOSIS — J441 Chronic obstructive pulmonary disease with (acute) exacerbation: Secondary | ICD-10-CM | POA: Diagnosis not present

## 2022-11-17 DIAGNOSIS — J441 Chronic obstructive pulmonary disease with (acute) exacerbation: Secondary | ICD-10-CM | POA: Diagnosis not present

## 2022-11-17 DIAGNOSIS — J9600 Acute respiratory failure, unspecified whether with hypoxia or hypercapnia: Secondary | ICD-10-CM | POA: Diagnosis not present

## 2022-12-01 DIAGNOSIS — J9600 Acute respiratory failure, unspecified whether with hypoxia or hypercapnia: Secondary | ICD-10-CM | POA: Diagnosis not present

## 2022-12-01 DIAGNOSIS — J441 Chronic obstructive pulmonary disease with (acute) exacerbation: Secondary | ICD-10-CM | POA: Diagnosis not present

## 2022-12-18 DIAGNOSIS — J441 Chronic obstructive pulmonary disease with (acute) exacerbation: Secondary | ICD-10-CM | POA: Diagnosis not present

## 2022-12-18 DIAGNOSIS — J9600 Acute respiratory failure, unspecified whether with hypoxia or hypercapnia: Secondary | ICD-10-CM | POA: Diagnosis not present

## 2023-01-01 DIAGNOSIS — J441 Chronic obstructive pulmonary disease with (acute) exacerbation: Secondary | ICD-10-CM | POA: Diagnosis not present

## 2023-01-01 DIAGNOSIS — J9600 Acute respiratory failure, unspecified whether with hypoxia or hypercapnia: Secondary | ICD-10-CM | POA: Diagnosis not present

## 2023-01-06 ENCOUNTER — Ambulatory Visit: Payer: No Typology Code available for payment source | Admitting: Adult Health

## 2023-01-29 DIAGNOSIS — J9611 Chronic respiratory failure with hypoxia: Secondary | ICD-10-CM | POA: Diagnosis not present

## 2023-01-29 DIAGNOSIS — E113399 Type 2 diabetes mellitus with moderate nonproliferative diabetic retinopathy without macular edema, unspecified eye: Secondary | ICD-10-CM | POA: Diagnosis not present

## 2023-01-29 DIAGNOSIS — Z23 Encounter for immunization: Secondary | ICD-10-CM | POA: Diagnosis not present

## 2023-01-29 DIAGNOSIS — I10 Essential (primary) hypertension: Secondary | ICD-10-CM | POA: Diagnosis not present

## 2023-01-29 DIAGNOSIS — J449 Chronic obstructive pulmonary disease, unspecified: Secondary | ICD-10-CM | POA: Diagnosis not present

## 2023-02-05 DIAGNOSIS — E113399 Type 2 diabetes mellitus with moderate nonproliferative diabetic retinopathy without macular edema, unspecified eye: Secondary | ICD-10-CM | POA: Diagnosis not present

## 2023-02-10 DIAGNOSIS — R0602 Shortness of breath: Secondary | ICD-10-CM | POA: Diagnosis not present

## 2023-02-10 DIAGNOSIS — R0902 Hypoxemia: Secondary | ICD-10-CM | POA: Diagnosis not present

## 2023-02-13 ENCOUNTER — Ambulatory Visit: Payer: No Typology Code available for payment source | Admitting: Adult Health

## 2023-09-16 DIAGNOSIS — Z0000 Encounter for general adult medical examination without abnormal findings: Secondary | ICD-10-CM | POA: Diagnosis not present

## 2023-09-16 DIAGNOSIS — J9611 Chronic respiratory failure with hypoxia: Secondary | ICD-10-CM | POA: Diagnosis not present

## 2023-09-16 DIAGNOSIS — Z9981 Dependence on supplemental oxygen: Secondary | ICD-10-CM | POA: Diagnosis not present

## 2023-09-16 DIAGNOSIS — I10 Essential (primary) hypertension: Secondary | ICD-10-CM | POA: Diagnosis not present

## 2023-09-16 DIAGNOSIS — E113399 Type 2 diabetes mellitus with moderate nonproliferative diabetic retinopathy without macular edema, unspecified eye: Secondary | ICD-10-CM | POA: Diagnosis not present

## 2023-09-16 DIAGNOSIS — Z79899 Other long term (current) drug therapy: Secondary | ICD-10-CM | POA: Diagnosis not present

## 2023-09-16 DIAGNOSIS — H6123 Impacted cerumen, bilateral: Secondary | ICD-10-CM | POA: Diagnosis not present

## 2023-09-16 DIAGNOSIS — J449 Chronic obstructive pulmonary disease, unspecified: Secondary | ICD-10-CM | POA: Diagnosis not present

## 2023-09-16 DIAGNOSIS — E119 Type 2 diabetes mellitus without complications: Secondary | ICD-10-CM | POA: Diagnosis not present

## 2023-09-16 DIAGNOSIS — E7800 Pure hypercholesterolemia, unspecified: Secondary | ICD-10-CM | POA: Diagnosis not present

## 2024-01-18 DIAGNOSIS — J9600 Acute respiratory failure, unspecified whether with hypoxia or hypercapnia: Secondary | ICD-10-CM | POA: Diagnosis not present

## 2024-01-18 DIAGNOSIS — J441 Chronic obstructive pulmonary disease with (acute) exacerbation: Secondary | ICD-10-CM | POA: Diagnosis not present

## 2024-02-17 DIAGNOSIS — J441 Chronic obstructive pulmonary disease with (acute) exacerbation: Secondary | ICD-10-CM | POA: Diagnosis not present

## 2024-02-17 DIAGNOSIS — J9600 Acute respiratory failure, unspecified whether with hypoxia or hypercapnia: Secondary | ICD-10-CM | POA: Diagnosis not present
